# Patient Record
Sex: Female | Born: 2000 | Hispanic: Yes | Marital: Single | Smoking: Never smoker
Health system: Southern US, Community
[De-identification: ages and names within clinical notes are randomized; demographics above are authoritative.]

## PROBLEM LIST (undated history)

## (undated) DIAGNOSIS — I10 Essential (primary) hypertension: Secondary | ICD-10-CM

## (undated) HISTORY — PX: APPENDECTOMY: SHX54

---

## 2020-09-12 ENCOUNTER — Inpatient Hospital Stay (HOSPITAL_COMMUNITY)
Admission: AD | Admit: 2020-09-12 | Discharge: 2020-09-12 | Disposition: A | Discharge status: Home / Self Care | Payer: Medicaid - Out of State | Attending: Family Medicine | Admitting: Family Medicine

## 2020-09-12 ENCOUNTER — Encounter (HOSPITAL_COMMUNITY): Payer: Self-pay | Admitting: Family Medicine

## 2020-09-12 DIAGNOSIS — O26891 Other specified pregnancy related conditions, first trimester: Secondary | ICD-10-CM | POA: Diagnosis not present

## 2020-09-12 DIAGNOSIS — Z3490 Encounter for supervision of normal pregnancy, unspecified, unspecified trimester: Secondary | ICD-10-CM

## 2020-09-12 DIAGNOSIS — R109 Unspecified abdominal pain: Secondary | ICD-10-CM | POA: Insufficient documentation

## 2020-09-12 DIAGNOSIS — O0931 Supervision of pregnancy with insufficient antenatal care, first trimester: Secondary | ICD-10-CM | POA: Insufficient documentation

## 2020-09-12 DIAGNOSIS — Z3689 Encounter for other specified antenatal screening: Secondary | ICD-10-CM

## 2020-09-12 DIAGNOSIS — Z3A09 9 weeks gestation of pregnancy: Secondary | ICD-10-CM | POA: Diagnosis not present

## 2020-09-12 HISTORY — DX: Essential (primary) hypertension: I10

## 2020-09-12 LAB — POCT PREGNANCY, URINE: Preg Test, Ur: POSITIVE — AB

## 2020-09-12 LAB — HCG, QUANTITATIVE, PREGNANCY: hCG, Beta Chain, Quant, S: 2288 m[IU]/mL — ABNORMAL HIGH (ref <5)

## 2020-09-12 MED ORDER — NIFEDIPINE ER OSMOTIC RELEASE 30 MG PO TB24: 30.0000 mg | ORAL_TABLET | Freq: Every day | ORAL | 5 refills | Status: DC

## 2020-09-12 NOTE — MAU Note (Signed)
Had positive HPT yesterday. C/o abd pain since yesterday.  Denies any vag bleeding or discharge. Pt states she has been told she has high B/P not an any meds. 160/101 today ?

## 2020-09-12 NOTE — MAU Note (Signed)
Urine in lobby 

## 2020-09-12 NOTE — MAU Provider Note (Addendum)
Chief Complaint:  Abdominal Pain   None    HPI: Joyce Barnes is a 20 y.o. G1P0 at [redacted]w[redacted]d who presents to maternity admissions reporting wanting confirmation of pregnancy. She states she had a positive at home pregnancy test yesterday. LMP unknown, as she states she has irregular menses. She did not menstruate in January or December. She states she did have intermittent abdominal cramping throughout the past week but it has since resolved. Felt like her normal period cramping. Denies vaginal bleeding, vaginal discharge, chest pain, headaches, nausea, vomiting, fever, falls, or recent illness. Of note, she has had elevated BP readings in MAU, most recently 157/94. She states she has a history of elevated BP since she was 20 yo but has not ever taken antihypertensive medications. This is a welcomed pregnancy. She is taking a prenatal vitamin.   Pregnancy Course: She has not yet established prenatal care.   Past Medical History:  Diagnosis Date  . Hypertension    OB History  Gravida Para Term Preterm AB Living  1            SAB IAB Ectopic Multiple Live Births               # Outcome Date GA Lbr Len/2nd Weight Sex Delivery Anes PTL Lv  1 Current            Past Surgical History:  Procedure Laterality Date  . APPENDECTOMY     History reviewed. No pertinent family history. Social History   Tobacco Use  . Smoking status: Never Smoker  . Smokeless tobacco: Never Used  Substance Use Topics  . Alcohol use: Not Currently  . Drug use: Never   No Known Allergies No medications prior to admission.    I have reviewed patient's Past Medical Hx, Surgical Hx, Family Hx, Social Hx, medications and allergies.   ROS:  Review of Systems  Constitutional: Negative for chills, fatigue and fever.  Respiratory: Negative for cough, shortness of breath and wheezing.   Cardiovascular: Negative for chest pain, palpitations and leg swelling.  Gastrointestinal: Negative for abdominal pain,  nausea and vomiting.  Genitourinary: Negative for difficulty urinating, flank pain, vaginal bleeding and vaginal discharge.  Neurological: Negative.  Negative for headaches.    Physical Exam   Patient Vitals for the past 24 hrs:  BP Temp Pulse Resp SpO2 Weight  09/12/20 1445 (!) 157/94 -- (!) 123 -- -- --  09/12/20 1242 (!) 160/101 98.8 F (37.1 C) (!) 122 18 100 % 123.4 kg    Constitutional: Well-developed, well-nourished female in no acute distress.  Cardiovascular: normal rate & rhythm, no murmur Respiratory: normal effort, lung sounds clear throughout GI: Abd soft, non-tender, gravid appropriate for gestational age. Pos BS x 4 MS: Extremities nontender, no edema, normal ROM Neurologic: Alert and oriented x 4.  GU: no CVA tenderness Pelvic: not indicated      Labs: Results for orders placed or performed during the hospital encounter of 09/12/20 (from the past 24 hour(s))  Pregnancy, urine POC     Status: Abnormal   Collection Time: 09/12/20 12:34 PM  Result Value Ref Range   Preg Test, Ur POSITIVE (A) NEGATIVE  ABO/Rh     Status: None   Collection Time: 09/12/20  2:17 PM  Result Value Ref Range   ABO/RH(D)      O POS Performed at [facility], [address] [place], [place] [address]     Imaging:  No results found.  MAU  Course: Orders Placed This Encounter  Procedures  . Beta hCG quant (ref lab)  . Urinalysis, Routine w reflex microscopic Urine, Clean Catch  . hCG, quantitative, pregnancy  . Pregnancy, urine POC  . ABO/Rh  . Discharge patient Discharge disposition: 01-Home or Self Care; Discharge patient date: 09/12/2020   No orders of the defined types were placed in this encounter.   MDM: Beta HCG urine positive. HCG Quantitative and UA pending. Blood type O positive. Bedside US without definitive visualization of IUP, though question of GS seen near top of endometrial lining.   Assessment: 1. Pregnancy, unspecified gestational age      Plan: Discharge home in stable condition. Discussed with patient the importance of controlling high blood pressure during pregnancy. Prescribed nifedipine. Discussed with patient initiating prenatal care at [facility] and she is agreeable. Patient  will be notified of the results of her pending tests. Message sent to [facility] to schedule viability/dating scan.    Allergies as of 09/12/2020   No Known Allergies     Medication List    TAKE these medications   NIFEdipine 30 MG 24 hr tablet Commonly known as: PROCARDIA-XL/NIFEDICAL-XL Take 1 tablet (30 mg total) by mouth daily.       Raylene Everts, PA Student 09/12/2020 5:09 PM   ATTENDING ATTESTATION  I have seen and examined this patient and agree with the above documentation in the PA student's note except as below.  I have repeated all portions of the history and physical exam and edited the above document.   Venora Maples, MD/[facility] [employer] (Faculty Practice) 09/12/2020, 8:10 PM

## 2020-09-12 NOTE — Discharge Instructions (Signed)
https://www.acog.org/womens-health/faqs/prenatal-genetic-screening-tests">  Cuidados prenatales Prenatal Care El cuidado prenatal es la atencin de la salud durante el [place]. Ayuda a que usted y su beb en gestacin (feto) se mantengan tan saludables como sea posible. El cuidado prenatal puede ser proporcionado por una partera, un mdico de familia, un profesional de Joyce Barnes (enfermero especializado o Joyce Barnes (medical) mdico) o un mdico especializado en Psychiatrist y Joyce Barnes ([place]). Joyce Barnes modo me afecta? Durante el embarazo, la controlarn minuciosamente para Barnes, manufacturing cualquier afeccin que Agricultural consultant. Para disminuir el riesgo de sufrir Clinical cytogeneticist, usted y el mdico hablarn acerca de las afecciones subyacentes que tenga. Cmo afecta esto al beb? El cuidado prenatal recibido desde un principio y de forma peridica aumenta la probabilidad de que su beb permanezca sano durante el embarazo. El cuidado prenatal disminuye el riesgo de que el beb:  Nazca de forma temprana (prematuramente).  Sea ms pequeo de lo esperado al nacer (pequeo para la edad gestacional). Qu puedo esperar en la primera visita de cuidado prenatal? Su primera visita de cuidado prenatal probablemente ser la ms larga. Programe la primera visita de cuidado prenatal tan pronto como advierta que est embarazada. Su primera visita es un buen momento para hacer preguntas o hablar sobre las inquietudes que tenga acerca del [place]. Antecedentes mdicos En su visita, usted y el mdico hablarn sobre sus antecedentes mdicos, incluidos:  Cualquier embarazo anterior.  Sus antecedentes mdicos familiares.  Los antecedentes mdicos del padre del beb.  Cualquier afeccin de salud a largo plazo (crnica) que tenga y cmo controlarla.  Cirugas o procedimientos a los que se someti.  Consumo actual de medicamentos recetados o de venta libre, hierbas o suplementos.  Otros factores que podran  representar un riesgo para el beb, incluidos los siguientes: ? Exposicin a sustancias qumicas nocivas o a radiacin en el trabajo o en su casa. ? Consumo de sustancias, incluidos el tabaco, el alcohol y las drogas.  Su entorno en el hogar y sus niveles de estrs, por ejemplo: ? Exposicin al abuso o la violencia. ? Problemas econmicos en casa.  Sus hbitos de salud diarios, incluida la dieta y la actividad fsica. Pruebas y exmenes Su mdico:  Medir su peso, altura y presin arterial.  Le realizar un examen fsico, incluido un examen plvico y [place].  Le realizar anlisis de [place] y [place] para detectar: ? Infeccin de las vas urinarias. ? Enfermedades de transmisin sexual (ETS). ? Niveles bajos de hierro en la sangre (anemia). ? Grupo sanguneo y ciertas protenas en los glbulos rojos (anticuerpos Rh). ? Infecciones e inmunidad a los virus, como el de la hepatitis B y Social Joyce officer, government. ? VIH (virus de inmunodeficiencia humana).  Analizar sus opciones de estudios de deteccin genticos. Consejos para Joyce Barnes Energy salud El mdico tambin le brindar informacin acerca de cmo mantenerse sana y Pharmacologist sano a su beb, por ejemplo:  Nutricin y vitaminas.  Actividad fsica.  Cmo controlar los sntomas del [place], como nuseas y vmitos (nuseas matutinas).  Infecciones y sustancias que pueden ser perjudiciales para el beb, y cmo evitarlas.  Salubridad de [employer].  Cuidado dental.  Trabajo.  Viajes.  Signos de advertencia a los que debe estar atenta y cundo llamar al mdico. Con qu frecuencia tendr que asistir a visitas de cuidado prenatal? Despus de la primera visita de cuidado prenatal, tendr que asistir a visitas en forma regular durante el embarazo. El cronograma de visitas a menudo es el siguiente:  Hasta la semana 28 de embarazo: [name]  vez cada 4 semanas.  Desde la semana 28 a la 36: una vez cada 2 semanas.  Despus de la semana 36: todas  las semanas [employer] . Es posible que algunas mujeres deban asistir a visitas con mayor o Adult nurse frecuencia segn las afecciones subyacentes de salud que tengan y la salud del beb. Concurra a todas las visitas de cuidado prenatal y de seguimiento. Esto es importante. Qu sucede durante las visitas rutinarias de cuidado prenatal? Su mdico:  Medir su peso y presin arterial.  Verificar la presencia de sonidos cardacos fetales.  Medir la altura de su tero y abdomen (altura uterina). Esta puede medirse aproximadamente a partir de la semana 20 del [place].  Controlar la posicin del beb dentro del tero.  Le har preguntas acerca de su dieta, patrones de sueo y si puede sentir los movimientos del beb.  Revisar los signos de advertencia a los que debe estar atenta y los signos del trabajo de [place].  Le preguntar acerca de los sntomas relacionados con el embarazo que tenga y cmo est lidiando con ellos. Los sntomas pueden incluir: ? Dolores de [place]. ? Nuseas y vmitos. ? Secrecin vaginal. ? [place]inchazn. ? Fatiga. ? Estreimiento. ? Cambios en la visin. ? Sentir tristeza o ansiedad constantemente. ? Cualquier molestia, incluido el dolor plvico o de espalda. ? Manchado o sangrado. Haga una lista de las preguntas que tenga para hacerle al mdico en las visitas de [place].   Qu estudios me podran Barnes, product/process development las visitas de cuidado prenatal? Es posible que le realicen anlisis de [employer], [place] y estudios de diagnstico por imgenes durante todo el [place], como los siguientes:  Este anlisis examina la presencia de glucosa, protenas o signos de infeccin en la orina.  Pruebas de glucosa para detectar una forma de diabetes que pueda desarrollarse durante el embarazo (diabetes mellitus gestacional). Generalmente, esto se realiza alrededor de lasemana 24 de embarazo.  Ecografas para verificar el crecimiento y desarrollo del beb, detectar defectos congnitos  y Company secretary del beb. Estas tambin pueden ayudar a decidir cundo debe dar a luz al beb.  Un estudio para Arboriculturist infeccin por estreptococos del grupo B (EGB). Generalmente, esto se realiza alrededor de lasemana 36 de embarazo.  Pruebas genticas. Estas pueden incluir anlisis de [place], lquidos o tejidos, o estudios de diagnstico por imgenes, como una ecografa. Algunos estudios genticos se Web designer trimestre de Psychiatrist y otros durante el segundo trimestre. Qu otras cosas puedo esperar durante las visitas de cuidado prenatal? El mdico puede recomendarle que se aplique algunas vacunas durante el [place]. Pueden incluir:  Una vacuna contra la gripe anual. Esto es especialmente importante si estar embarazada durante la temporada de gripe.  La vacuna Tdap (ttanos, difteria y [place]). Recibir esta vacuna durante el embarazo puede proteger al beb de la tos convulsa (tos ferina) despus del nacimiento. Esta vacuna puede recomendarse [employer] 27 y 36 de [place].  Una vacuna contra el COVID-19. Ms adelante en su Vanetta Mulders, su mdico puede proporcionarle informacin acerca de:  Clases para prepararse para el parto y Patent examiner.  Cmo elegir un mdico para el beb.  Bancos de cordn umbilical.  Lactancia materna.  Utilizacin de mtodos anticonceptivos despus del nacimiento del beb.  La unidad de parto y [place] de parto del hospital, y cmo programar una visita.  Cmo registrarse en el hospital antes de comenzar el [place] de [place]. Dnde buscar ms informacin  Office on [employer] (Oficina  para la Salud de Architectural technologist): TravelLesson.ca  American Pregnancy Association (Asociacin Americana del [place]): americanpregnancy.org  March of Dimes: marchofdimes.org Resumen  El cuidado prenatal [place] a que usted y su beb se mantengan tan saludables como sea posible durante el [place].  Su primera visita de cuidado  prenatal probablemente ser la ms larga.  Tendr que asistir a visitas y International aid/development worker estudios durante todo el embarazo para Chief Operating Officer su salud y la salud del beb.  Lleve una lista de preguntas para hacerle al mdico durante las visitas.  Asegrese de asistir a todas las visitas de cuidado prenatal y de Clinical Joyce associate. Esta informacin no tiene Theme park manager el consejo del mdico. Asegrese de hacerle al mdico cualquier pregunta que tenga. Document Revised: 06/11/2020 Document Reviewed: 06/11/2020 Elsevier Patient Education  2021 [employer].

## 2020-09-13 ENCOUNTER — Telehealth: Payer: Self-pay | Admitting: Family Medicine

## 2020-09-13 LAB — ABO/RH: ABO/RH(D): O POS

## 2020-09-13 NOTE — Telephone Encounter (Signed)
Spanish - pt states she has vaginal itching and needs to know if this is normal. Pt has new ob appt  Wed 315pm..

## 2020-09-16 NOTE — Telephone Encounter (Signed)
Called pt w/[employer] interpreter # [phone]. She stated that the vaginal itching began on 1/27. She has not tried any OTC medications. I advised pt that her symptom may be from a vaginal yeast infection. She was advised that she may try OTC Monistat per package directions or she can wait until her appt on 2/2 and the doctor will perform a test. Pt also asked if it is normal to have cramping. I advised that this is normal during early pregnancy. If the cramping becomes severe or if she develops heavy vaginal bleeding, she should go immediately tot he hospital. Pt voiced understanding of all information and instructions given.

## 2020-09-18 ENCOUNTER — Other Ambulatory Visit: Payer: Self-pay

## 2020-09-18 ENCOUNTER — Ambulatory Visit (INDEPENDENT_AMBULATORY_CARE_PROVIDER_SITE_OTHER): Payer: Medicaid - Out of State | Admitting: Family Medicine

## 2020-09-18 VITALS — BP 151/82 | HR 108 | Ht 66.0 in | Wt 270.8 lb

## 2020-09-18 DIAGNOSIS — Z3491 Encounter for supervision of normal pregnancy, unspecified, first trimester: Secondary | ICD-10-CM

## 2020-09-18 DIAGNOSIS — O10919 Unspecified pre-existing hypertension complicating pregnancy, unspecified trimester: Secondary | ICD-10-CM | POA: Insufficient documentation

## 2020-09-18 DIAGNOSIS — O0990 Supervision of high risk pregnancy, unspecified, unspecified trimester: Secondary | ICD-10-CM | POA: Insufficient documentation

## 2020-09-18 MED ORDER — NIFEDIPINE ER OSMOTIC RELEASE 60 MG PO TB24: 60.0000 mg | ORAL_TABLET | Freq: Every day | ORAL | 5 refills | Status: DC

## 2020-09-18 NOTE — Progress Notes (Addendum)
Pt has scheduled [place] for dating on 2/14 at 1545 pm with MFM due to unknown LMP. Pt aware and agreeable.   Used [employer] [phone]

## 2020-09-18 NOTE — Progress Notes (Signed)
     Subjective:   Joyce Barnes is a 20 y.o. K1S0 at [redacted]w[redacted]d by uncertain LMP being seen today for her first obstetrical visit.  Her obstetrical history is significant for cHTN, obesity.   Although patient initially here for new OB, on bedside US only barely able to see IUP with embryo, no fetal cardiac activity observed.   HISTORY: OB History  Gravida Para Term Preterm AB Living  1 0 0 0 0 0  SAB IAB Ectopic Multiple Live Births  0 0 0 0 0    # Outcome Date GA Lbr Len/2nd Weight Sex Delivery Anes PTL Lv  1 Current            Last pap smear was: none on file  Past Medical History:  Diagnosis Date  . Hypertension    Past Surgical History:  Procedure Laterality Date  . APPENDECTOMY     No family history on file. Social History   Tobacco Use  . Smoking status: Never Smoker  . Smokeless tobacco: Never Used  Substance Use Topics  . Alcohol use: Not Currently  . Drug use: Never   No Known Allergies No current outpatient medications on file prior to visit.   No current facility-administered medications on file prior to visit.     Exam   Vitals:   09/18/20 1559 09/18/20 1611  BP: (!) 151/82   Pulse: (!) 108   Weight: 270 lb 12.8 oz (122.8 kg)   Height:  $Remove'5\' 6"'HNQPODS$  (1.676 m)      Uterus:     System: General: well-developed, well-nourished female in no acute distress   Breast:  normal appearance, no masses or tenderness   Skin: normal coloration and turgor, no rashes   Neurologic: oriented, normal, negative, normal mood   Extremities: normal strength, tone, and muscle mass, ROM of all joints is normal   HEENT PERRLA, extraocular movement intact and sclera clear, anicteric   Mouth/Teeth mucous membranes moist, pharynx normal without lesions and dental hygiene good   Neck supple and no masses   Respiratory:  no respiratory distress     Assessment:   Pregnancy: G1P0 Patient Active Problem List   Diagnosis Date Noted  . Supervision of high risk pregnancy,  antepartum 09/18/2020  . Chronic hypertension affecting pregnancy 09/18/2020     Plan:  1. Uncertain dates, antepartum, first trimester Patient here for new OB but appears to be only 4-[redacted] weeks along Discussed with patient that we should have a dating and viability scan and reschedule a new OB based on this new dating She is in agreement with this plan - US OB LESS THAN 14 WEEKS WITH OB TRANSVAGINAL; Future  2. Supervision of high risk pregnancy, antepartum Basic labs obtained today in anticipation of subsequent new OB visit Defer remainder until next visit (Pap, genetic screening, etc.) - Hemoglobin A1c - CBC/D/Plt+RPR+Rh+ABO+Rub Ab... - Culture, OB Urine  3. Chronic hypertension affecting pregnancy Seen by me in the MAU last week, started on Nifed 30 XL Today BP's remain in high end of mild range, will increase Nifed to 60 XL daily Baseline labs - NIFEdipine (PROCARDIA XL/NIFEDICAL XL) 60 MG 24 hr tablet; Take 1 tablet (60 mg total) by mouth daily.  Dispense: 30 tablet; Refill: 5 - Protein / creatinine ratio, urine - Comp Met (CMET)    Return in 6 weeks (on 10/30/2020).

## 2020-09-18 NOTE — Patient Instructions (Signed)
Obstetrics: Normal and Problem Pregnancies (7th ed., pp. 102-121). [place], [place]: Elsevier."> Textbook of Family Medicine (9th ed., pp. [id number]). [place]: Restaurant manager, fast food trimestre de Psychiatrist First Trimester of Pregnancy  El primer trimestre de Community education officer de su ltimo periodo menstrual hasta el final de la semana 12. Es Designer, jewellery desde el mes 1 hasta el mes 3 de [place]. Una semana despus de que un espermatozoide fecunda un vulo, este se implantar en la pared uterina y comenzar a desarrollarse hasta convertirse en un beb. Al final de las 12 [address], se formarn todos los rganos del beb y el beb tendr un tamao de [place] 2 y 3 pulgadas. Durante Financial risk analyst trimestre, ocurren cambios en el cuerpo Su organismo atraviesa por muchos cambios durante el [place]. Los cambios varan y generalmente vuelven a la normalidad despus del nacimiento del beb. Cambios fsicos  Usted puede aumentar o bajar de [place].  Las ConAgra Foods pueden empezar a Government social research officer y Emergency planning/management officer. El tejido que rodea los pezones (arola) puede tornarse ms oscuro.  Pueden aparecer zonas oscuras o manchas (cloasma o mscara del embarazo) en el rostro.  Tal vez haya cambios en el cabello. Estos pueden incluir engrosamiento, afinamiento y Allied Waste Industries textura. Cambios en la salud  Quizs sienta nuseas y es posible que vomite.  Es posible que tenga [place] estomacal.  Comienza a tener dolores de [place].  Puede tener estreimiento.  Las Veterinary surgeon y estar sensibles al cepillado y al hilo dental. Otros cambios  Puede cansarse con facilidad.  Puede orinar con mayor frecuencia.  Los perodos menstruales se interrumpirn.  Tal vez no tenga apetito.  Puede sentir un fuerte deseo de consumir ciertos alimentos.  Puede tener cambios en sus emociones de un da para Therapist, art.  Tendr sueos ms vvidos y extraos. Siga estas instrucciones en su  casa: Medicamentos  Siga las instrucciones del mdico en relacin con el uso de medicamentos. Durante el embarazo, hay medicamentos que pueden tomarse y otros que no. No use ningn medicamento a menos que se los haya indicado el mdico.  Tome vitaminas prenatales que contengan por lo menos (mcg) de cido flico. Comida y bebida  Lleve una dieta saludable que incluya frutas y verduras frescas, cereales integrales, buenas fuentes de protenas como carnes [place], huevos o tofu, y productos lcteos descremados.  Evite la carne cruda y el [place], la [place] y el queso sin Market researcher. Estos portan grmenes que pueden provocar dao tanto a usted como al beb.  Siente nuseas o vomita: ? Ingiera 4 o 5comidas pequeas por Geophysical data processor de 3abundantes. ? Intente comer algunas galletitas saladas. ? Beba lquidos Altria Group, en lugar de [employer] Scientific.  Es posible que tenga que tomar estas medidas para prevenir o tratar el estreimiento: ? Product manager suficiente lquido como para Pharmacologist la orina de color amarillo plido. ? Consumir alimentos ricos en fibra, como frijoles, cereales integrales, y frutas y verduras frescas. ? Limitar el consumo de alimentos ricos en grasa y azcares procesados, como los alimentos fritos o dulces. Actividad  Haga ejercicio solamente como se lo haya indicado el mdico. La mayora de las personas pueden continuar su rutina de ejercicios durante el [place]. Intente realizar como mnimo de actividad fsica por lo menos 5das a la semana.  Deje de hacer ejercicio si le aparecen dolor o clicos en la parte baja del vientre o de la espalda.  Evite hacer ejercicio si hace mucho calor o humedad,  o si se encuentra a una altitud elevada.  Evite levantar pesos Fortune Brands.  Si lo desea, puede seguir teniendo The [place]. [place] Travelers, salvo que el mdico le indique lo contrario. Alivio del dolor y del Dentist  Use un sostn que le brinde buen  soporte para Engineer, materials de [place].  Descanse con las piernas elevadas si tiene calambres o dolor de cintura.  Si desarrolla venas abultadas (vrices) en las piernas: ? Use medias de compresin como se lo haya indicado el mdico. ? Eleve los pies durante , 3 o 4veces por da. ? Limite el consumo de sal en su dieta. Seguridad  Use el cinturn de seguridad en todo momento mientras conduce o va en auto.  Hable con el mdico si es vctima de [employer] o fsico.  Hable con el mdico si se siente triste o tiene pensamientos acerca de [place] dao a usted misma. Estilo de vida  No se d baos de inmersin en agua caliente, baos turcos ni saunas.  No se haga duchas vaginales. No use tampones ni toallas higinicas perfumadas.  No use remedios a base de hierbas, alcohol, drogas ilegales ni medicamentos que no estn aprobados por el mdico. Las sustancias qumicas de estos productos pueden daar al beb.  No consuma ningn producto que contenga nicotina o tabaco, como cigarrillos, cigarrillos electrnicos y tabaco de Theatre manager. Si necesita ayuda para dejar de fumar, consulte al mdico.  Evite el contacto con las bandejas sanitarias de los gatos y la tierra que estos animales usan. Estos elementos contienen bacterias que pueden causar defectos congnitos al beb y la posible prdida del beb en gestacin (feto) debido a un aborto espontneo o muerte fetal. Instrucciones generales  Durante las visitas prenatales de rutina del Engineer, maintenance trimestre, el mdico le har un examen fsico, Education officer, environmental las pruebas necesarias y le preguntar cmo Merchant navy officer las cosas. Cumpla con todas las visitas de seguimiento. Esto es importante.  Pida ayuda si tiene necesidades nutricionales o de asesoramiento Academic librarian. El mdico puede aconsejarla o derivarla a especialistas para que la ayuden con diferentes necesidades.  Programe una cita con el dentista. En su casa, lvese los dientes con un cepillo  dental suave. Psese el hilo dental suavemente.  Escriba sus preguntas. Llvelas cuando concurra a las visitas prenatales. Dnde buscar ms informacin  American Pregnancy Association (Asociacin Americana del Embarazo): americanpregnancy.org  Cel[employer] Corporation of Obstetricians and Gynecologists (Colegio Estadounidense de Obstetras y [place]): EmploymentAssurance.cz?  Office on Pitney Bowes (Oficina para la Salud de la Mujer): [email or web address] Comunquese con un mdico si tiene:  Research scientist (life sciences).  Grant Ruts.  Clicos leves, presin en la pelvis o dolor persistente en el abdomen.  Nuseas, vmitos o diarrea que duran 24 horas o ms.  Una secrecin vaginal con mal olor.  Dolor al Beatrix Shipper.  Una enfermedad contagiosa, como varicela, sarampin, virus de [place], VIH o hepatitis. Solicite ayuda inmediatamente si:  Tiene manchado o sangrado de la vagina.  Tiene dolor intenso o clicos en el abdomen.  Siente que le falta el aire o dolor en el pecho.  Sufre cualquier tipo de traumatismo, por ejemplo, debido a una cada o un accidente automovilstico.  Dolor, hinchazn o enrojecimiento nuevos en un brazo o una pierna, o un aumento de alguno de estos sntomas. Resumen  Financial risk analyst trimestre del Community education officer de su ltimo periodo menstrual hasta el final de la semana 12 (meses 1 al 3).  Hacer 4 o 5 comidas pequeas al Geophysical data processor de 3 comidas grandes  tambin [employer]s nuseas y los vmitos.  No consuma ningn producto que contenga nicotina o tabaco, como cigarrillos, cigarrillos electrnicos y tabaco de Theatre manager. Si necesita ayuda para dejar de fumar, consulte al mdico.  Cumpla con todas las visitas de seguimiento. Esto es importante. Esta informacin no tiene Theme park manager el consejo del mdico. Asegrese de hacerle al mdico cualquier pregunta que tenga. Document Revised: 02/05/2020 Document Reviewed: 02/05/2020 Elsevier Patient Education   2021 Elsevier Inc.   Southern Company del mtodo anticonceptivo Contraception Choices La anticoncepcin, o los mtodos anticonceptivos, hace referencia a los mtodos o dispositivos que evitan el [place]. Mtodos hormonales Implante anticonceptivo Un implante anticonceptivo consiste en un tubo delgado de plstico que contiene una hormona que evita el [place]. Es diferente de un dispositivo intrauterino (DIU). Un mdico lo inserta en la parte superior del brazo. Los implantes pueden ser eficaces durante un mximo de 3 aos. Inyecciones de progestina sola Las inyecciones de progestina sola contienen progestina, una forma sinttica de la hormona progesterona. Un mdico las administra cada 3 meses. Pldoras anticonceptivas Las pldoras anticonceptivas son pastillas que contienen hormonas que evitan el [place]. Deben tomarse una vez al da, preferentemente a la misma Economist. Se necesita una receta para utilizar este mtodo anticonceptivo. Parche anticonceptivo El parche anticonceptivo contiene hormonas que evitan el [place]. Se coloca en la piel, debe cambiarse una vez a la semana durante tres semanas y debe retirarse en la cuarta semana. Se necesita una receta para utilizar este mtodo anticonceptivo. Anillo vaginal Un anillo vaginal contiene hormonas que evitan el embarazo. Se coloca en la vagina durante tres semanas y se retira en la cuarta semana. Luego se repite el proceso con un anillo nuevo. Se necesita una receta para utilizar este mtodo anticonceptivo. Anticonceptivo de emergencia Los anticonceptivos de emergencia son mtodos para evitar un embarazo despus de Warehouse manager sexo sin proteccin. Vienen en forma de pldora y pueden tomarse hasta 5 das despus de [place]. Funcionan mejor cuando se toman lo ms pronto posible luego de eBay. La mayora de los anticonceptivos de emergencia estn disponibles sin receta mdica. Este mtodo no debe utilizarse como el nico mtodo anticonceptivo.    Mtodos de barrera Condn masculino Un condn masculino es una vaina delgada que se coloca sobre el pene durante el sexo. Los condones evitan que el esperma ingrese en el cuerpo de la [place]. Pueden utilizarse con un una sustancia que mata a los espermatozoides (espermicida) para aumentar la efectividad. Deben desecharse despus de un uso. Condn femenino Un condn femenino es una vaina blanda y holgada que se coloca en la vagina antes de [place]. El condn evita que el esperma ingrese en el cuerpo de la [place]. Deben desecharse despus de un uso. Diafragma Un diafragma es una barrera blanda con forma de cpula. Se inserta en la vagina antes del sexo, junto con un espermicida. El diafragma bloquea el ingreso de esperma en el tero, y el espermicida mata a los espermatozoides. El Designer, fashion/clothing en la vagina durante 6 a 8 horas despus de Warehouse manager sexo y debe retirarse en el plazo de las 24 horas. Un diafragma es recetado y colocado por un mdico. Debe reemplazarse cada 1 a 2 aos, despus de dar a luz, de aumentar ms de 15lb (6.8kg) y de [place] plvica. Capuchn cervical Un capuchn cervical es una copa redonda y blanda de ltex o plstico que se coloca en el cuello uterino. Se inserta en la vagina antes del sexo, junto  con un espermicida. Bloquea el ingreso del esperma en el tero. El capuchn Radio producerdebe permanecer en el lugar durante 6 a 8 horas despus de Warehouse managertener sexo y debe retirarse en el plazo de las 48 horas. Un capuchn cervical debe ser recetado y colocado por un mdico. Debe reemplazarse cada 2aos. Esponja Una esponja es una pieza blanda y circular de espuma de poliuretano que contiene espermicida. La esponja ayuda a bloquear el ingreso de esperma en el tero, y el espermicida mata a los espermatozoides. Belva BertinPara utilizarla, debe humedecerla e insertarla en la vagina. Debe insertarse antes de eBaytener sexo, debe permanecer dentro al menos durante 6 horas despus de tener sexo y debe  retirarse y Nurse, adultdesecharse en el plazo de las 30 horas. Espermicidas Los espermicidas son sustancias qumicas que matan o bloquean al esperma y no lo dejan ingresar al cuello uterino y al tero. Vienen en forma de crema, gel, supositorio, espuma o comprimido. Un espermicida debe insertarse en la vagina con un aplicador al menos 10 o 15 minutos antes de tener sexo para dar tiempo a que surta Janesvilleefecto. El proceso debe repetirse cada vez que tenga sexo. Los espermicidas no requieren Emergency planning/management officerreceta mdica.   Anticonceptivos intrauterinos Dispositivo intrauterino (DIU) Un DIU es un dispositivo en forma de T que se coloca en el tero. Existen dos tipos:  DIU hormonal.Este tipo contiene progestina, una forma sinttica de la hormona progesterona. Este tipo puede permanecer colocado durante 3 a 5 aos.  DIU de cobre.Este tipo est recubierto con un alambre de cobre. Puede permanecer colocado durante 10 aos. Mtodos anticonceptivos permanentes Ligadura de trompas en la mujer En este mtodo, se sellan, atan u obstruyen las trompas de [place]op[place] durante una ciruga para Automotive engineerevitar que el vulo descienda Raleighhacia el tero. Esterilizacin histeroscpica En este mtodo, se coloca un implante pequeo y flexible dentro de cada trompa de Falopio. Los implantes hacen que se forme un tejido cicatricial en las trompas de Falopio y que las obstruya para que el espermatozoide no pueda llegar al vulo. El procedimiento demora alrededor de 3 meses para que sea [place] Clubefectivo. Debe utilizarse otro mtodo anticonceptivo durante esos 3 meses. Esterilizacin masculina Este es un procedimiento que consiste en atar los conductos que transportan el esperma (vasectoma). Luego del procedimiento, el hombre Manufacturing engineerpuede eyacular lquido (semen). Debe utilizarse otro mtodo anticonceptivo durante 3 meses despus del procedimiento. Mtodos de planificacin natural Planificacin familiar natural En este mtodo, la pareja no tiene American Family Insurancesexo durante los das en que la mujer  podra quedar Beaver Creekembarazada. Mtodo calendario En este mtodo, la mujer realiza un seguimiento de la duracin de cada ciclo menstrual, identifica los Becton, Dickinson and Companydas en los que se puede producir un Psychiatristembarazo y no tiene sexo durante esos [address]  [address]das. Mtodo de la ovulacin En este mtodo, la pareja evita tener sexo durante la ovulacin. Mtodo sintotrmico Este mtodo implica no tener sexo durante la ovulacin. Normalmente, la mujer comprueba la ovulacin al observar cambios en su temperatura y en la consistencia del moco cervical. Mtodo posovulacin En este mtodo, la pareja espera a que finalice la ovulacin para Doctor, hospitaltener sexo. Dnde buscar ms informacin  Centers for Disease Control and Prevention (Centros para el Control y Psychiatristla Prevencin de Event organisernfermedades): FootballExhibition.com.brwww.cdc.gov Resumen  La anticoncepcin, o los mtodos anticonceptivos, hace referencia a los mtodos o dispositivos que evitan el Centropolisembarazo.  Los mtodos anticonceptivos hormonales incluyen implantes, inyecciones, pastillas, parches, anillos vaginales y anticonceptivos de Associate Professoremergencia.  Los mtodos anticonceptivos de barrera pueden incluir condones masculinos, condones femeninos, diafragmas, capuchones cervicales, esponjas y espermicidas.  Guardian Life Insurance tipos de DIU (dispositivo intrauterino). Un DIU puede colocarse en el tero de una mujer para evitar el embarazo durante 3 a 5 aos.  La esterilizacin permanente puede realizarse mediante un procedimiento tanto en los hombres como en las mujeres. Los The Kroger de Medical sales representative natural implican no tener American Family Insurance la mujer podra quedar [place]. Esta informacin no tiene Theme park manager el consejo del mdico. Asegrese de hacerle al mdico cualquier pregunta que tenga. Document Revised: 03/05/2020 Document Reviewed: 03/05/2020 Elsevier Patient Education  2021 Elsevier Inc.   Stewart materna Breastfeeding  Decidir amamantar es una de las mejores elecciones que puede hacer por  usted y su beb. Un cambio en las hormonas durante el embarazo hace que las mamas produzcan leche materna en las glndulas productoras de [place] a la [place]. Las hormonas impiden que la leche materna sea liberada antes del nacimiento del beb. Adems, impulsan el flujo de leche luego del nacimiento. Una vez que ha comenzado a Museum/gallery exhibitions officer, Conservation officer, nature beb, as Immunologist succin o Theatre manager, pueden estimular la liberacin de [place] de las glndulas productoras de [place]. Los beneficios de Smith International investigaciones demuestran que la lactancia materna ofrece muchos beneficios de salud para bebs y [place]. Adems, ofrece una forma gratuita y conveniente de Corporate treasurer al beb. Para el beb  La primera leche (calostro) ayuda a Careers information officer funcionamiento del aparato digestivo del beb.  Las clulas especiales de la leche (anticuerpos) ayudan a Artist las infecciones en el beb.  Los bebs que se alimentan con leche materna tambin tienen menos probabilidades de tener asma, alergias, obesidad o diabetes de tipo 2. Adems, tienen menor riesgo de sufrir el sndrome de muerte sbita del lactante (SMSL).  Los nutrientes de la [place] materna son mejores para Patent examiner las necesidades del beb en comparacin con la CHS Inc.  La leche materna mejora el desarrollo cerebral del beb. Para usted  La lactancia materna favorece el desarrollo de un vnculo muy especial entre la madre y el beb.  Es conveniente. La leche materna es econmica y siempre est disponible a la Human resources officer.  La lactancia materna ayuda a quemar caloras. Claude Manges a perder el peso ganado durante el [place].  Hace que el tero vuelva al tamao que tena antes del embarazo ms rpido. Adems, disminuye el sangrado (loquios) despus del parto.  La lactancia materna contribuye a reducir Nurse, adult de tener diabetes de tipo 2, osteoporosis, artritis reumatoide, enfermedades cardiovasculares y cncer de mama, ovario, tero y endometrio en  el futuro. Informacin bsica sobre la lactancia Comienzo de la lactancia  Encuentre un lugar cmodo para sentarse o Teacher, music, con un buen respaldo para el cuello y la espalda.  Coloque una almohada o una manta enrollada debajo del beb para acomodarlo a la altura de la mama (si est sentada). Las almohadas para Museum/gallery exhibitions officer se han diseado especialmente a fin de servir de apoyo para los brazos y el beb [employer] Foods.  Asegrese de que la barriga del beb (abdomen) est frente a la suya.  [place]ee suavemente la mama. Con las yemas de los dedos, Liberty Media bordes exteriores de la mama hacia adentro, en direccin al pezn. Esto estimula el flujo de [place]. Si la Home [employer]ot, es posible que deba Educational psychologist con este movimiento durante la Market researcher.  Sostenga la mama con 4 dedos por debajo y Multimedia programmer por arriba del pezn (forme la letra "C" con la mano). Asegrese de que los dedos se encuentren lejos del pezn  y de la boca del beb.  Empuje suavemente los labios del beb con el pezn o con el dedo.  Cuando la boca del beb se abra lo suficiente, acrquelo rpidamente a la mama e introduzca todo el pezn y la arola, tanto como sea posible, dentro de la boca del beb. La arola es la zona de color que rodea al pezn. ? Debe haber ms arola visible por arriba del labio superior del beb que por debajo del labio inferior. ? Los labios del beb deben estar abiertos y extendidos hacia afuera (evertidos) para asegurar que el beb se prenda de forma adecuada y cmoda. ? La lengua del beb debe estar entre la enca inferior y Educational psychologist.  Asegrese de que la boca del beb est en la posicin correcta alrededor del pezn (prendido). Los labios del beb deben crear un sello sobre la mama y estar doblados hacia afuera (invertidos).  Es comn que el beb succione durante 2 a 3 minutos para que comience el flujo de [place]. Cmo debe prenderse Es muy importante que le ensee al beb cmo  prenderse adecuadamente a la mama. Si el beb no se prende adecuadamente, puede causar Federated Department Stores, reducir la produccin de [place] materna y Radio producer que el beb tenga un escaso aumento de [place]. Adems, si el beb no se prende adecuadamente al pezn, puede tragar aire durante la alimentacin. Esto puede causarle molestias al beb. Hacer eructar al beb al P[place] Plate de mama puede ayudarlo a liberar el aire. Sin embargo, ensearle al beb cmo prenderse a la mama adecuadamente es la mejor manera de evitar que se sienta molesto por tragar Oceanographer se alimenta. Signos de que el beb se ha prendido adecuadamente al pezn  Tironea o succiona de modo silencioso, sin Publishing rights manager. Los labios del beb deben estar extendidos hacia afuera (evertidos).  Se escucha que traga cada 3 o 4 succiones una vez que la [employer] ha comenzado a Radiographer, therapeutic (despus de que se produzca el reflejo de eyeccin de la [place]).  Hay movimientos musculares por arriba y por delante de sus odos al Printmaker. Signos de que el beb no se ha prendido Audiological scientist al pezn  Hace ruidos de succin o de chasquido mientras se Tree surgeon.  Siente dolor en los pezones. Si cree que el beb no se prendi correctamente, deslice el dedo en la comisura de la boca y Ameren Corporation las encas del beb para interrumpir la succin. Intente volver a comenzar a Museum/gallery exhibitions officer. Signos de Market researcher materna exitosa Signos del beb  El beb disminuir gradualmente el nmero de succiones o dejar de succionar por completo.  El beb se quedar dormido.  El cuerpo del beb se relajar.  El beb retendr [name] pequea cantidad de [facility] boca.  El beb se desprender solo del [place]. Signos que presenta usted  Las mamas han aumentado la firmeza, el peso y el tamao 1 a 3 horas despus de Museum/gallery exhibitions officer.  Estn ms blandas inmediatamente despus de amamantar.  Se producen un aumento del volumen de [place] y un cambio en su consistencia y color hacia el  quinto da de Market researcher.  Los pezones no duelen, no estn agrietados ni sangran. Signos de que su beb recibe la cantidad de leche suficiente  Mojar por lo menos 1 o 2paales durante las primeras 24horas despus del nacimiento.  Mojar por lo menos 5 o 6paales cada 24horas durante la primera semana despus del nacimiento. La orina debe ser clara o de color amarillo  plido a los 5das de vida.  Mojar entre 6 y 8paales cada 24horas a medida que el beb sigue creciendo y desarrollndose.  Defeca por lo menos 3 veces en 24 horas a los 5 [address] de [address] Las heces deben ser blandas y Armed forces operational officer.  Defeca por lo menos 3 veces en 24 horas a los [address] de [address] Las heces deben ser grumosas y Armed forces operational officer.  No registra una prdida de peso mayor al 10% del peso al nacer durante los primeros 3 [address] de [place].  Aumenta de peso un promedio de 4 a 7onzas (113 a 198g) por semana despus de los 4 [address] de vida.  Aumenta de [place], [place], de [place] uniforme a Glass blower/designer de los 5 [address] de vida, sin Passenger transport manager prdida de peso despus de las 2semanas de vida. Despus de alimentarse, es posible que el beb regurgite una pequea cantidad de [place]. Esto es normal. Frecuencia y duracin de la lactancia El amamantamiento frecuente la ayudar a producir ms [place] y puede prevenir dolores en los pezones y las mamas extremadamente llenas (congestin [place]). Alimente al beb cuando muestre signos de hambre o si siente la necesidad de reducir la congestin de las [place]. Esto se denomina "lactancia a demanda". Las seales de que el beb tiene hambre incluyen las siguientes:  Aumento del Orr de [place], [place] o inquietud.  Mueve la cabeza de un lado a otro.  Abre la boca cuando se le toca la mejilla o la comisura de la boca (reflejo de bsqueda).  Aumenta las vocalizaciones, tales como sonidos de succin, se relame los labios, emite arrullos, suspiros o chirridos.  Mueve la [employer] boca y se chupa  los dedos o las manos.  Est molesto o llora. Evite el uso del chupete en las primeras 4 a 6 semanas despus del nacimiento del beb. Despus de este perodo, podr usar un chupete. Las investigaciones demostraron que el uso del chupete durante Financial risk analyst ao de vida del beb disminuye el riesgo de tener el sndrome de muerte sbita del lactante (SMSL). Permita que el nio se alimente en cada mama todo lo que desee. Cuando el beb se desprende o se queda dormido mientras se est alimentando de la primera mama, ofrzcale la segunda. Debido a que, con frecuencia, los recin nacidos estn somnolientos las primeras semanas de vida, es posible que deba despertar al beb para alimentarlo. Los horarios de Acupuncturist de un beb a otro. Sin embargo, las siguientes reglas pueden servir como gua para ayudarla a Lawyer que el beb se alimenta adecuadamente:  Se puede amamantar a los recin nacidos (bebs de 4 semanas o menos de vida) cada 1 a 3 horas.  No deben transcurrir ms de 3 horas durante el da o 5 horas durante la noche sin que se amamante a los recin nacidos.  Debe amamantar al beb un mnimo de 8 veces en un perodo de 24 horas. Extraccin de [employer] extraccin y Contractor de la leche materna le permiten asegurarse de que el beb se alimente exclusivamente de su leche materna, aun en momentos en los que no puede Museum/gallery exhibitions officer. Esto tiene especial importancia si debe regresar al Aleen Campi en el perodo en que an est amamantando o si no puede estar presente en los momentos en que el beb debe alimentarse. Su asesor en lactancia puede ayudarla a Clinical research associate un mtodo de extraccin que funcione mejor para usted y Programmer, systems cunto tiempo es seguro almacenar [place].  Cmo cuidar las mamas durante la lactancia Los pezones pueden secarse, Lobbyist y doler durante la Market researcher. Las siguientes recomendaciones pueden ayudarla a Pharmacologist las [employer] y  sanas:  Careers information officer usar jabn en los pezones.  Use un sostn de soporte diseado especialmente para la lactancia materna. Evite usar sostenes con aro o sostenes muy ajustados (sostenes deportivos).  Seque al aire sus pezones durante 3 a despus de amamantar al beb.  Utilice solo apsitos de Haematologist sostn para Environmental health practitioner las prdidas de [place]. La prdida de un poco de [employer] Group tomas es normal.  Utilice lanolina sobre los pezones luego de Museum/gallery exhibitions officer. La lanolina ayuda a mantener la humedad normal de la piel. La lanolina pura no es perjudicial (no es txica) para el beb. Adems, puede extraer [employer] algunas gotas de [place] materna y Engineer, maintenance (IT) suavemente esa [employer] pezones para que la [place] se seque al aire. Durante las primeras semanas despus del nacimiento, algunas mujeres experimentan [place]. La congestin [employer] [place] [employer] puede hacer que sienta las mamas pesadas, calientes y sensibles al tacto. El pico de la congestin mamaria ocurre en el plazo de los 3 a 5 das despus del Green. Las siguientes recomendaciones pueden ayudarla a Paramedic la congestin mamaria:  Vace por completo las mamas al [employer] o Environmental health practitioner. Puede aplicar calor hmedo en las mamas (en la ducha o con toallas hmedas para manos) antes de Museum/gallery exhibitions officer o extraer [employer]. Esto aumenta la circulacin y [place] a que la [place]. Si el beb no vaca por completo las [address] cuando lo [address], extraiga la [place] restante despus de que haya finalizado.  Aplique compresas de hielo [employer] inmediatamente despus de Museum/gallery exhibitions officer o extraer [place], a menos que le resulte demasiado incmodo. Haga lo siguiente: ? Ponga el hielo en una bolsa plstica. ? Coloque una First[employer] Corp piel y la bolsa de hielo. ? Coloque el hielo durante , 2 o 3veces por da.  Asegrese de que el beb est prendido y se encuentre en la posicin correcta mientras lo alimenta. Si la congestin mamaria  persiste luego de 48 horas o despus de seguir estas recomendaciones, comunquese con su mdico o un Holiday representative. Recomendaciones de salud general durante la lactancia  Consuma 3 comidas y 3 colaciones saludables todos los [place]. Las [employer] Holdings bien alimentadas que amamantan necesitan entre 450 y 500 caloras adicionales por Futures trader. Puede cumplir con este requisito al aumentar la cantidad de una dieta equilibrada que realice.  Beba suficiente agua para mantener la orina clara o de color amarillo plido.  Descanse con frecuencia, reljese y siga tomando sus vitaminas prenatales para prevenir la fatiga, el estrs y los niveles bajos de vitaminas y [employer] en el cuerpo (deficiencias de nutrientes).  No consuma ningn producto que contenga nicotina o tabaco, como cigarrillos y Administrator, Civil Service. El beb puede verse afectado por las sustancias qumicas de los cigarrillos que pasan a la [place] materna y por la exposicin al humo ambiental del tabaco. Si necesita ayuda para dejar de fumar, consulte al mdico.  Evite el consumo de alcohol.  No consuma drogas ilegales o marihuana.  Antes de Dietitian, hable con el mdico. Estos incluyen medicamentos recetados y de [place], como tambin vitaminas y suplementos a base de hierbas. Algunos medicamentos, que pueden ser perjudiciales para el beb, pueden pasar a travs de la [employer] Pal[employer].  Puede quedar embarazada durante la lactancia. Si se desea un mtodo anticonceptivo, consulte al [employer] Company  son las opciones seguras durante la Market researcherlactancia. Dnde encontrar ms informacin: Liga internacional [place]ec[place]: [email or web address]://[email or web address]. Comunquese con un mdico si:  Siente que quiere dejar de Museum/gallery exhibitions officeramamantar o se siente frustrada con la lactancia.  Sus pezones estn agrietados o Water quality scientistsangran.  Sus mamas estn irritadas, sensibles o calientes.  Tiene los siguientes sntomas: ? Dolor en las mamas o en los pezones. ? Un rea hinchada en cualquiera  de las mamas. ? Grant RutsFiebre o escalofros. ? Nuseas o vmitos. ? Drenaje de otro lquido distinto de la WPS Resourcesleche materna desde los pezones.  Sus mamas no se llenan antes de Museum/gallery exhibitions officeramamantar al beb para el quinto da despus del Silver Cityparto.  Se siente triste y deprimida.  El beb: ? Est demasiado somnoliento como para comer bien. ? Tiene problemas para dormir. ? Tiene ms de 1 semana de vida y HCA Incmoja menos de 6 paales en un periodo de 24 horas. ? No ha aumentado de Carrilloburghpeso a los [address] das de [address].  El beb defeca menos de 3 veces en 24 horas.  La piel del beb o las partes blancas de los ojos se vuelven amarillentas. Solicite ayuda de inmediato si:  El beb est muy cansado Retail buyer(letargo) y no se quiere despertar para comer.  Le sube la fiebre sin causa. Resumen  La lactancia materna ofrece muchos beneficios de salud para bebs y Hosmermadres.  Intente amamantar a su beb cuando muestre signos tempranos de hambre.  Haga cosquillas o empuje suavemente los labios del beb con el dedo o el pezn para lograr que el beb abra la boca. Acerque el beb a la mama. Asegrese de que la mayor parte de la arola se encuentre dentro de la boca del beb. Ofrzcale una mama y haga eructar al beb antes de pasar a la otra.  Hable con su mdico o asesor en lactancia si tiene dudas o problemas con la lactancia. Esta informacin no tiene Theme park managercomo fin reemplazar el consejo del mdico. Asegrese de hacerle al mdico cualquier pregunta que tenga. Document Revised: 10/28/2017 Document Reviewed: 11/23/2016 Elsevier Patient Education  2021 Arvin[employer]vier Inc.

## 2020-09-19 LAB — PROTEIN / CREATININE RATIO, URINE
Creatinine, Urine: 200.6 mg/dL
Protein, Ur: 10.8 mg/dL
Protein/Creat Ratio: 54 mg/g creat (ref 0–200)

## 2020-09-20 ENCOUNTER — Telehealth: Payer: Self-pay | Admitting: Family Medicine

## 2020-09-20 LAB — COMPREHENSIVE METABOLIC PANEL
ALT: 49 IU/L — ABNORMAL HIGH (ref 0–32)
AST: 31 IU/L (ref 0–40)
Albumin/Globulin Ratio: 1.6 (ref 1.2–2.2)
Albumin: 4.5 g/dL (ref 3.9–5.0)
Alkaline Phosphatase: 99 IU/L (ref 44–121)
BUN/Creatinine Ratio: 10 (ref 9–23)
BUN: 6 mg/dL (ref 6–20)
Bilirubin Total: 0.3 mg/dL (ref 0.0–1.2)
CO2: 18 mmol/L — ABNORMAL LOW (ref 20–29)
Calcium: 9.6 mg/dL (ref 8.7–10.2)
Chloride: 101 mmol/L (ref 96–106)
Creatinine, Ser: 0.61 mg/dL (ref 0.57–1.00)
GFR calc Af Amer: 150 mL/min/{1.73_m2} (ref >59)
GFR calc non Af Amer: 130 mL/min/{1.73_m2} (ref >59)
Globulin, Total: 2.8 g/dL (ref 1.5–4.5)
Glucose: 94 mg/dL (ref 65–99)
Potassium: 3.7 mmol/L (ref 3.5–5.2)
Sodium: 137 mmol/L (ref 134–144)
Total Protein: 7.3 g/dL (ref 6.0–8.5)

## 2020-09-20 LAB — CBC/D/PLT+RPR+RH+ABO+RUB AB...
Antibody Screen: NEGATIVE
Basophils Absolute: 0 10*3/uL (ref 0.0–0.2)
Basos: 0 %
EOS (ABSOLUTE): 0.2 10*3/uL (ref 0.0–0.4)
Eos: 2 %
Hematocrit: 44.1 % (ref 34.0–46.6)
Hemoglobin: 15.3 g/dL (ref 11.1–15.9)
Hepatitis B Surface Ag: NEGATIVE
Immature Grans (Abs): 0 10*3/uL (ref 0.0–0.1)
Immature Granulocytes: 0 %
Lymphocytes Absolute: 3.6 10*3/uL — ABNORMAL HIGH (ref 0.7–3.1)
Lymphs: 33 %
MCH: 28.9 pg (ref 26.6–33.0)
MCHC: 34.7 g/dL (ref 31.5–35.7)
MCV: 83 fL (ref 79–97)
Monocytes Absolute: 0.4 10*3/uL (ref 0.1–0.9)
Monocytes: 4 %
Neutrophils Absolute: 6.5 10*3/uL (ref 1.4–7.0)
Neutrophils: 61 %
Platelets: 335 10*3/uL (ref 150–450)
RBC: 5.29 x10E6/uL — ABNORMAL HIGH (ref 3.77–5.28)
RDW: 12.2 % (ref 11.7–15.4)
RPR Ser Ql: REACTIVE — AB
Rh Factor: POSITIVE
Rubella Antibodies, IGG: 9.19 index (ref >0.99)
Varicella zoster IgG: <135 index — ABNORMAL LOW (ref >165)
WBC: 10.8 10*3/uL (ref 3.4–10.8)

## 2020-09-20 LAB — RPR, QUANT+TP ABS (REFLEX)
Rapid Plasma Reagin, Quant: 1:1 {titer} — ABNORMAL HIGH
T Pallidum Abs: NONREACTIVE

## 2020-09-20 LAB — HEMOGLOBIN A1C
Est. average glucose Bld gHb Est-mCnc: 97 mg/dL
Hgb A1c MFr Bld: 5 % (ref 4.8–5.6)

## 2020-09-20 NOTE — Telephone Encounter (Signed)
With Spanish Interpreter Raquel M. I informed pt results and that we can f/u with her at her OB visit.  Pt verbalized understanding.   Addison Naegeli, RN  09/20/20

## 2020-09-20 NOTE — Telephone Encounter (Signed)
Please call patient and let her know that her labs came back unremarkable with exception of false positive RPR (her T pallidum Ab's were negative) and mildly elevated ALT that is likely related to obesity. We'll see her in a couple weeks for her new OB.

## 2020-09-21 ENCOUNTER — Other Ambulatory Visit: Payer: Self-pay

## 2020-09-21 ENCOUNTER — Encounter (HOSPITAL_COMMUNITY): Payer: Self-pay | Admitting: Obstetrics and Gynecology

## 2020-09-21 ENCOUNTER — Inpatient Hospital Stay (HOSPITAL_COMMUNITY): Payer: Medicaid - Out of State

## 2020-09-21 ENCOUNTER — Inpatient Hospital Stay (HOSPITAL_COMMUNITY)
Admission: AD | Admit: 2020-09-21 | Discharge: 2020-09-21 | Disposition: A | Discharge status: Home / Self Care | Payer: Medicaid - Out of State | Attending: Obstetrics and Gynecology | Admitting: Obstetrics and Gynecology

## 2020-09-21 DIAGNOSIS — R109 Unspecified abdominal pain: Secondary | ICD-10-CM

## 2020-09-21 DIAGNOSIS — R102 Pelvic and perineal pain: Secondary | ICD-10-CM | POA: Diagnosis not present

## 2020-09-21 DIAGNOSIS — O208 Other hemorrhage in early pregnancy: Secondary | ICD-10-CM | POA: Diagnosis not present

## 2020-09-21 DIAGNOSIS — Z3A11 11 weeks gestation of pregnancy: Secondary | ICD-10-CM | POA: Diagnosis not present

## 2020-09-21 DIAGNOSIS — O418X10 Other specified disorders of amniotic fluid and membranes, first trimester, not applicable or unspecified: Secondary | ICD-10-CM

## 2020-09-21 DIAGNOSIS — O468X1 Other antepartum hemorrhage, first trimester: Secondary | ICD-10-CM | POA: Diagnosis not present

## 2020-09-21 DIAGNOSIS — O161 Unspecified maternal hypertension, first trimester: Secondary | ICD-10-CM | POA: Insufficient documentation

## 2020-09-21 DIAGNOSIS — O26891 Other specified pregnancy related conditions, first trimester: Secondary | ICD-10-CM | POA: Diagnosis not present

## 2020-09-21 DIAGNOSIS — Z79899 Other long term (current) drug therapy: Secondary | ICD-10-CM | POA: Diagnosis not present

## 2020-09-21 LAB — CBC
HCT: 41.8 % (ref 36.0–46.0)
Hemoglobin: 15 g/dL (ref 12.0–15.0)
MCH: 29.9 pg (ref 26.0–34.0)
MCHC: 35.9 g/dL (ref 30.0–36.0)
MCV: 83.4 fL (ref 80.0–100.0)
Platelets: 338 10*3/uL (ref 150–400)
RBC: 5.01 MIL/uL (ref 3.87–5.11)
RDW: 12.4 % (ref 11.5–15.5)
WBC: 9.9 10*3/uL (ref 4.0–10.5)
nRBC: 0 % (ref 0.0–0.2)

## 2020-09-21 LAB — COMPREHENSIVE METABOLIC PANEL
ALT: 44 U/L (ref 0–44)
AST: 34 U/L (ref 15–41)
Albumin: 4.1 g/dL (ref 3.5–5.0)
Alkaline Phosphatase: 72 U/L (ref 38–126)
Anion gap: 10 (ref 5–15)
BUN: 5 mg/dL — ABNORMAL LOW (ref 6–20)
CO2: 22 mmol/L (ref 22–32)
Calcium: 9.2 mg/dL (ref 8.9–10.3)
Chloride: 104 mmol/L (ref 98–111)
Creatinine, Ser: 0.64 mg/dL (ref 0.44–1.00)
GFR, Estimated: >60 mL/min (ref >60)
Glucose, Bld: 114 mg/dL — ABNORMAL HIGH (ref 70–99)
Potassium: 3.8 mmol/L (ref 3.5–5.1)
Sodium: 136 mmol/L (ref 135–145)
Total Bilirubin: 0.7 mg/dL (ref 0.3–1.2)
Total Protein: 7.1 g/dL (ref 6.5–8.1)

## 2020-09-21 LAB — URINALYSIS, ROUTINE W REFLEX MICROSCOPIC
Bacteria, UA: NONE SEEN
Bilirubin Urine: NEGATIVE
Glucose, UA: NEGATIVE mg/dL
Hgb urine dipstick: NEGATIVE
Ketones, ur: 20 mg/dL — AB
Nitrite: NEGATIVE
Protein, ur: NEGATIVE mg/dL
Specific Gravity, Urine: 1.024 (ref 1.005–1.030)
pH: 5 (ref 5.0–8.0)

## 2020-09-21 LAB — CULTURE, OB URINE

## 2020-09-21 LAB — URINE CULTURE, OB REFLEX: Organism ID, Bacteria: NO GROWTH

## 2020-09-21 LAB — HCG, QUANTITATIVE, PREGNANCY: hCG, Beta Chain, Quant, S: 25962 m[IU]/mL — ABNORMAL HIGH (ref <5)

## 2020-09-21 NOTE — MAU Note (Signed)
Pt reports to mau with c/o pink dc for the past 2 days and lower abd cramping for the past week.  Pt reports she took 2 treatments of monistat last week for suspected yeast infection, but stopped on Friday because she was feeling better.  Pt denies itching or odor today.

## 2020-09-21 NOTE — MAU Provider Note (Addendum)
Patient Joyce Barnes is a 20 y.o.  G1P0  at [redacted]w[redacted]d here with complaints of abdominal pain for one week and bloody discharge in the  Morning. She denies fever, respiratory difficulties, SOB.    History     CSN: [id number]  Arrival date and time: 09/21/20 1108   None     Chief Complaint  Patient presents with  . Abdominal Pain  . Vaginal Discharge   Abdominal Pain This is a new problem. The current episode started in the past 7 days. The problem occurs intermittently. The pain is located in the suprapubic region. The pain is at a severity of 5/10. The quality of the pain is cramping. The abdominal pain does not radiate.  Vaginal Discharge The patient's primary symptoms include vaginal discharge. The current episode started in the past 7 days. The problem occurs intermittently. She is pregnant. Associated symptoms include abdominal pain. The vaginal discharge was bloody. The vaginal bleeding is spotting. She has not been passing clots. She has not been passing tissue.    OB History    Gravida  1   Para      Term      Preterm      AB      Living        SAB      IAB      Ectopic      Multiple      Live Births              Past Medical History:  Diagnosis Date  . Hypertension     Past Surgical History:  Procedure Laterality Date  . APPENDECTOMY      History reviewed. No pertinent family history.  Social History   Tobacco Use  . Smoking status: Never Smoker  . Smokeless tobacco: Never Used  Substance Use Topics  . Alcohol use: Not Currently  . Drug use: Never    Allergies: No Known Allergies  Medications Prior to Admission  Medication Sig Dispense Refill Last Dose  . NIFEdipine (PROCARDIA XL/NIFEDICAL XL) 60 MG 24 hr tablet Take 1 tablet (60 mg total) by mouth daily. 30 tablet 5 09/21/2020 at 1000    Review of Systems  Constitutional: Negative.   HENT: Negative.   Respiratory: Negative.   Gastrointestinal: Positive for abdominal pain.   Genitourinary: Positive for vaginal bleeding and vaginal discharge.  Musculoskeletal: Negative.   Neurological: Negative.   Psychiatric/Behavioral: Negative.    Physical Exam   Blood pressure (!) 160/89, pulse (!) 119, temperature 98 F (36.7 C), temperature source Oral, resp. rate 17, weight 122.7 kg, last menstrual period 07/06/2020, SpO2 100 %.  Physical Exam Constitutional:      Appearance: She is well-developed.  Abdominal:     General: Abdomen is flat.     Palpations: Abdomen is soft.  Genitourinary:    Vagina: Normal. No vaginal discharge or tenderness.     Cervix: No discharge.     Uterus: Normal.   Neurological:     Mental Status: She is alert.     MAU Course  Procedures  MDM  Due to patient's early gestation, and concern for ectopic pregnancy, will do ectopic work-up.   I have independently reviewed the [place] images, which reveal finding of fetal pole with cardiac activity, small subchorionic hemorrhage noted. No other concerns noted on imaging.  -declines wet prep and GC CT -Blood type is O pos   Patient Vitals for the past 24 hrs:  BP Temp Temp  src Pulse Resp SpO2 Weight  09/21/20 1325 (!) 160/89 - - (!) 119 17 - -  09/21/20 1137 (!) 174/102 98 F (36.7 C) Oral (!) 121 18 - 122.7 kg  09/21/20 1136 - - - - - 100 % -    -Patient's BP elevated while in MAU; she took procardia this morning at 10 am. She reports that she takes her medicine daily.  Discussed with Dr. Vergie Living, who recommends encouraging patient to take daily and have BP check in three days at [facility]. May consider increasing to 90 mgs of Procardia XL  Assessment and Plan   1. Subchorionic hemorrhage of placenta in first trimester, single or unspecified fetus   2. Abdominal pain   3. Hypertension affecting pregnancy in first trimester     2. Patient stable for discharge; reviewed subchorionic hemorrhage and pelvic rest. Keep OB appt on 10/31/2020  3. Will cancel [place] appt on 02/14; message  sent  5. Continue to take blood pressure medicine daily; message sent to make patient a nurse visit on 2/7-2/9 for BP check. Reviewed importance of blood pressure control in pregnancy. Patient verbalized understanding.  Charlesetta Garibaldi Kooistra 09/21/2020, 1:40 PM

## 2020-09-21 NOTE — Discharge Instructions (Signed)
Hematoma subcorinico Subchorionic Hematoma  Un hematoma es una acumulacin de sangre fuera de los vasos sanguneos. Un hematoma subcorinico es una acumulacin de sangre entre la pared externa del embrin (corion) y la pared interna del tero. Esta afeccin puede causar hemorragia vaginal. Generalmente, los hematomas pequeos que ocurren al principio del embarazo se reducen por su propia cuenta y no afectan al beb ni al embarazo. Cuando la hemorragia comienza ms tarde en el embarazo, o el hematoma es ms grande o se produce en una paciente de edad avanzada, la afeccin puede ser ms grave. Los hematomas ms grandes aumentan las posibilidades de aborto espontneo. Esta afeccin tambin aumenta los siguientes riesgos:  Separacin prematura de la placenta del tero.  Parto antes de trmino (prematuro).  Muerte fetal. Cules son las causas? Se desconoce la causa exacta de esta afeccin. Ocurre cuando la sangre queda atrapada entre la placenta y la pared uterina porque la placenta se ha separado del lugar original del implante. Qu incrementa el riesgo? Es ms probable que sufra esta afeccin si:  Recibi tratamiento con medicamentos para la fertilidad.  Qued embarazada a travs de la fertilizacin in vitro (FIV). Cules son los signos o sntomas? Los sntomas de esta afeccin incluyen:  Prdida o hemorragia vaginal.  Dolor abdominal. Esto es poco frecuente. En ocasiones, puede no haber sntomas y la hemorragia solo se puede ver cuando se toman imgenes ecogrficas (ecografa transvaginal). Cmo se diagnostica? Esta afeccin se diagnostica en funcin de un examen fsico. Es un examen plvico. Tambin pueden hacerle otras pruebas, incluidas las siguientes:  Anlisis de sangre.  Anlisis de orina.  Ecografa del abdomen. Cmo se trata? El tratamiento de esta afeccin puede variar. El tratamiento puede incluir:  Observacin cautelosa. La observarn atentamente para detectar  cualquier cambio en la hemorragia.  Medicamentos.  Restriccin de las actividades. Puede ser necesaria hasta que se detenga la hemorragia.  Un medicamento llamado inmunoglobulina Rh. Este se da si usted tiene un grupo sanguneo Rh negativo. Previene la sensibilizacin al Rh. Siga estas instrucciones en su casa:  Haga reposo en cama si se lo indica el mdico.  No levante objetos que pesen ms de 10libras (4.5kg) o el lmite que le haya indicado el mdico.  Lleve un registro escrito de la cantidad de toallas higinicas que utiliza cada da y cun empapadas (saturadas) estn.  No use tampones.  Cumpla con todas las visitas de seguimiento. Esto es importante. El profesional podr pedirle que se realice anlisis de seguimiento, ecografas o ambas. Comunquese con un mdico si:  Tiene una hemorragia vaginal.  Tiene fiebre. Solicite ayuda de inmediato si:  Siente calambres intensos en el estmago, en la espalda, en el abdomen o en la pelvis.  Elimina cogulos o tejidos grandes. Guarde los tejidos para que su mdico los vea.  Se desmaya.  Se siente mareada o dbil. Resumen  Un hematoma subcorinico es una acumulacin de sangre entre la pared externa del embrin (corion) y la pared interna del tero.  Esta afeccin puede causar hemorragia vaginal.  En ocasiones, puede no haber sntomas y la hemorragia solo se puede ver cuando se toman imgenes ecogrficas.  El tratamiento puede incluir una observacin cautelosa, medicamentos o restriccin de las actividades.  Cumpla con todas las visitas de seguimiento. Solicite ayuda de inmediato si tiene clicos fuertes o una hemorragia vaginal intensa. Esta informacin no tiene como fin reemplazar el consejo del mdico. Asegrese de hacerle al mdico cualquier pregunta que tenga. Document Revised: 06/14/2020 Document Reviewed: 06/14/2020 Elsevier   Elsevier Patient Education  2021 Elsevier Inc.   

## 2020-09-25 ENCOUNTER — Ambulatory Visit (INDEPENDENT_AMBULATORY_CARE_PROVIDER_SITE_OTHER): Payer: Self-pay | Admitting: *Deleted

## 2020-09-25 ENCOUNTER — Other Ambulatory Visit: Payer: Self-pay

## 2020-09-25 ENCOUNTER — Encounter: Payer: Medicaid - Out of State | Admitting: Family Medicine

## 2020-09-25 VITALS — BP 129/71 | HR 106 | Ht 66.0 in | Wt 266.9 lb

## 2020-09-25 DIAGNOSIS — O10919 Unspecified pre-existing hypertension complicating pregnancy, unspecified trimester: Secondary | ICD-10-CM

## 2020-09-25 DIAGNOSIS — O0990 Supervision of high risk pregnancy, unspecified, unspecified trimester: Secondary | ICD-10-CM

## 2020-09-25 NOTE — Progress Notes (Signed)
Chart reviewed for nurse visit. Agree with plan of care.   Venora Maples, MD 09/25/20 10:11 AM

## 2020-09-25 NOTE — Progress Notes (Signed)
Here for bp check due to early pregnant with HTN. States started procardia 60 mg XL( taking two of the 30 mg tablets )  on 09/18/20. BP 151/ 83. Denies headaches, visual disturbances, or edema. States nervous because always nervous at doctors office. BP repeated 149 84.  BP and assessment reported to Dr. Crissie Reese. Orders to repeat bp and if either number 150/90 or higher to increase dose to 90.  Bp in one-two weeks. 3rd bp wnl at 129/71. Advised to continue Procardia 60 daily as ordered. bp   Schedule bp Check one week. Also advised to pick up Procardia 60 mg XL RX to have when runs out of 30 mg tablets. Clarified dosage. Linda,RN

## 2020-09-30 ENCOUNTER — Ambulatory Visit: Payer: Medicaid - Out of State

## 2020-10-02 ENCOUNTER — Ambulatory Visit (INDEPENDENT_AMBULATORY_CARE_PROVIDER_SITE_OTHER): Payer: Self-pay

## 2020-10-02 ENCOUNTER — Other Ambulatory Visit: Payer: Self-pay

## 2020-10-02 ENCOUNTER — Other Ambulatory Visit (HOSPITAL_COMMUNITY)
Admission: RE | Admit: 2020-10-02 | Discharge: 2020-10-02 | Disposition: A | Discharge status: Home / Self Care | Payer: Self-pay | Source: Ambulatory Visit | Attending: Family Medicine | Admitting: Family Medicine

## 2020-10-02 VITALS — BP 141/93 | HR 110 | Wt 263.4 lb

## 2020-10-02 DIAGNOSIS — Z0130 Encounter for examination of blood pressure without abnormal findings: Secondary | ICD-10-CM

## 2020-10-02 DIAGNOSIS — N949 Unspecified condition associated with female genital organs and menstrual cycle: Secondary | ICD-10-CM | POA: Insufficient documentation

## 2020-10-02 DIAGNOSIS — N9489 Other specified conditions associated with female genital organs and menstrual cycle: Secondary | ICD-10-CM

## 2020-10-02 NOTE — Progress Notes (Signed)
Chart reviewed for nurse visit. Agree with plan of care.   Venora Maples, MD 10/02/20 12:15 PM

## 2020-10-02 NOTE — Progress Notes (Signed)
Pt here today for BP check. Pt is [redacted]w[redacted]d pregnant with history of chronic hypertension. Began nifedipine 60 mg daily following initial appt on 09/18/20. BP today reviewed with Crissie Reese, MD who states pt may continue on current dosage of nifedipine. Pt to follow up at appt 10/31/20.  Pt complains of vaginal burning, denies any abnormal discharge. Pt agreeable to vaginal swab today. Self-swab instructions given and specimen obtained. Pt will be notified with any abnormal results.  Interpreter Eda royal present for entire encounter.   Fleet Contras RN 10/02/20

## 2020-10-03 ENCOUNTER — Other Ambulatory Visit: Payer: Self-pay | Admitting: Family Medicine

## 2020-10-03 LAB — CERVICOVAGINAL ANCILLARY ONLY
Bacterial Vaginitis (gardnerella): POSITIVE — AB
Candida Glabrata: NEGATIVE
Candida Vaginitis: NEGATIVE
Chlamydia: NEGATIVE
Comment: NEGATIVE
Comment: NEGATIVE
Comment: NEGATIVE
Comment: NEGATIVE
Comment: NEGATIVE
Comment: NORMAL
Neisseria Gonorrhea: NEGATIVE
Trichomonas: NEGATIVE

## 2020-10-03 MED ORDER — METRONIDAZOLE 500 MG PO TABS: 500.0000 mg | ORAL_TABLET | Freq: Two times a day (BID) | ORAL | 0 refills | Status: AC

## 2020-10-03 NOTE — Progress Notes (Signed)
bv treatment 

## 2020-10-16 ENCOUNTER — Encounter: Payer: Medicaid - Out of State | Admitting: Obstetrics and Gynecology

## 2020-10-17 ENCOUNTER — Inpatient Hospital Stay (HOSPITAL_COMMUNITY)
Admission: AD | Admit: 2020-10-17 | Discharge: 2020-10-17 | Disposition: A | Discharge status: Home / Self Care | Payer: Self-pay | Attending: Obstetrics & Gynecology | Admitting: Obstetrics & Gynecology

## 2020-10-17 ENCOUNTER — Encounter (HOSPITAL_COMMUNITY): Payer: Self-pay | Admitting: Obstetrics & Gynecology

## 2020-10-17 ENCOUNTER — Other Ambulatory Visit: Payer: Self-pay

## 2020-10-17 DIAGNOSIS — R109 Unspecified abdominal pain: Secondary | ICD-10-CM | POA: Insufficient documentation

## 2020-10-17 DIAGNOSIS — O10911 Unspecified pre-existing hypertension complicating pregnancy, first trimester: Secondary | ICD-10-CM | POA: Insufficient documentation

## 2020-10-17 DIAGNOSIS — A084 Viral intestinal infection, unspecified: Secondary | ICD-10-CM | POA: Insufficient documentation

## 2020-10-17 DIAGNOSIS — O99891 Other specified diseases and conditions complicating pregnancy: Secondary | ICD-10-CM

## 2020-10-17 DIAGNOSIS — O26891 Other specified pregnancy related conditions, first trimester: Secondary | ICD-10-CM | POA: Insufficient documentation

## 2020-10-17 DIAGNOSIS — O98511 Other viral diseases complicating pregnancy, first trimester: Secondary | ICD-10-CM | POA: Insufficient documentation

## 2020-10-17 DIAGNOSIS — Z79899 Other long term (current) drug therapy: Secondary | ICD-10-CM | POA: Insufficient documentation

## 2020-10-17 DIAGNOSIS — Z3A09 9 weeks gestation of pregnancy: Secondary | ICD-10-CM | POA: Insufficient documentation

## 2020-10-17 LAB — URINALYSIS, ROUTINE W REFLEX MICROSCOPIC
Bilirubin Urine: NEGATIVE
Glucose, UA: NEGATIVE mg/dL
Hgb urine dipstick: NEGATIVE
Ketones, ur: NEGATIVE mg/dL
Leukocytes,Ua: NEGATIVE
Nitrite: NEGATIVE
Protein, ur: NEGATIVE mg/dL
Specific Gravity, Urine: 1.016 (ref 1.005–1.030)
pH: 5 (ref 5.0–8.0)

## 2020-10-17 NOTE — MAU Note (Signed)
Presents with c/o abdominal pain that began yesterday, reports pain is located in the entire left abdomen.  States hasn't taken any meds for pain.  Denies VB.

## 2020-10-17 NOTE — MAU Provider Note (Signed)
History    CSN: [id number]  Arrival date and time: 10/17/20 0856   Chief Complaint  Patient presents with  . Abdominal Pain   HPI: Ms. Joyce Barnes is a 20 y/o female G1P0 presenting to the MAU at 9 weeks 4 days with new onset abdominal pain. She states that the pain was sudden onset with associated cramping, and was woken from sleep at 3:00am with diarrhea. She ate a dish prepared by a relative containing rice and spaghetti last night, and the pain and diarrhea occurred several hours later. The cramping is a 3/10 and has been waxing and waning. There is diffuse abdominal tenderness with a focal area in the LLQ. She denies any spotting, vaginal discharge, burning with urination, dysuria, straining, melena, hematochezia, fever, or chills. She endorses nausea, but denies vomiting. She has not attempted any interventions.    OB History    Gravida  1   Para      Term      Preterm      AB      Living        SAB      IAB      Ectopic      Multiple      Live Births              Past Medical History:  Diagnosis Date  . Hypertension     Past Surgical History:  Procedure Laterality Date  . APPENDECTOMY      Family History  Problem Relation Age of Onset  . Healthy Mother   . Diabetes Father   . Cancer Maternal Aunt     Social History   Tobacco Use  . Smoking status: Never Smoker  . Smokeless tobacco: Never Used  Vaping Use  . Vaping Use: Never used  Substance Use Topics  . Alcohol use: Not Currently  . Drug use: Never    Allergies: No Known Allergies  Medications Prior to Admission  Medication Sig Dispense Refill Last Dose  . NIFEdipine (PROCARDIA XL/NIFEDICAL XL) 60 MG 24 hr tablet Take 1 tablet (60 mg total) by mouth daily. 30 tablet 5 10/17/2020 at 0700  . Prenatal Vit-Fe Fumarate-FA (PRENATAL VITAMINS PO) Take 1 tablet by mouth daily.   10/16/2020 at 1200    Review of Systems  Constitutional: Positive for appetite change. Negative for activity  change, chills and fever.  HENT: Negative for congestion, rhinorrhea, sore throat and trouble swallowing.   Eyes: Negative.  Negative for photophobia and visual disturbance.  Respiratory: Negative for cough, chest tightness and shortness of breath.   Cardiovascular: Negative for chest pain and leg swelling.  Gastrointestinal: Positive for abdominal pain, diarrhea and nausea. Negative for abdominal distention, blood in stool, constipation and vomiting.  Genitourinary: Negative for difficulty urinating, dysuria, hematuria, pelvic pain, vaginal bleeding, vaginal discharge and vaginal pain.  Neurological: Negative for dizziness, syncope and headaches.  Psychiatric/Behavioral: Negative.    Physical Exam   Blood pressure (!) 161/92, pulse (!) 126, temperature 98.8 F (37.1 C), resp. rate 18, last menstrual period 07/06/2020, SpO2 100 %.  Physical Exam Constitutional:      Appearance: She is well-developed.  HENT:     Head: Normocephalic and atraumatic.  Eyes:     Extraocular Movements: Extraocular movements intact.     Pupils: Pupils are equal, round, and reactive to light.  Cardiovascular:     Rate and Rhythm: Normal rate and regular rhythm.     Heart sounds: Normal heart sounds. No  murmur heard. No friction rub. No gallop.   Pulmonary:     Effort: Pulmonary effort is normal.     Breath sounds: Normal breath sounds.  Abdominal:     General: Bowel sounds are normal. There is no distension.     Palpations: Abdomen is soft.     Tenderness: There is generalized abdominal tenderness and tenderness in the left lower quadrant.  Skin:    General: Skin is warm and dry.     Capillary Refill: Capillary refill takes less than 2 seconds.  Neurological:     General: No focal deficit present.     Mental Status: She is alert and oriented to person, place, and time.  Psychiatric:        Mood and Affect: Mood normal.        Behavior: Behavior normal.     MAU Course  Procedures POCUS  abdominal  - Bedside ultrasound allowed visualization of an intact IUP with a fetal heart rate of ~160  - No complications noted   MDM H&P type: Detail Medical decision making: Low complexity Time spent with patient: Less than 30 minutes  Assessment and Plan  1. Nulligravida pregnancy 9wk4d  - IUP and fetal heartbeat visualized with bedside ultrasound, FHR ~160 -Continue taking prenatal vitamins and presenting for regular prenatal appointments.  -Present back to the MAU with intense abdominal pain, vaginal bleeding, headaches, blurry vision, chest pain, or shortness of breath  2. Chronic hypertension - BP elevated in MAU today -Continue taking nifedipine  -Will need to monitor BP at home during pregnancy and upload recordings to [employer]  3. Viral gastroenteritis  -Clinical diagnosis due to ingestion of new food and then sudden onset abdominal pain, nausea, and diarrhea.  -Continue drinking fluids and maintain a bland diet until nausea and diarrhea improve   Gillermo Murdoch, PA-S 10/17/2020, 10:21 AM

## 2020-10-17 NOTE — Discharge Instructions (Signed)
Gastroenteritis viral en los adultos Viral Gastroenteritis, Adult  La gastroenteritis viral tambin se conoce como gripe estomacal. Esta afeccin podra afectar el estmago, el intestino delgado y el intestino grueso. Puede causar Scherrie Bateman, fiebre y vmitos repentinos. Esta afeccin es causada por muchos virus diferentes. Estos virus pueden transmitirse de [name] persona a otra con mucha facilidad (son contagiosos). La diarrea y los vmitos pueden hacerlo sentir dbil y causar deshidratacin. Es posible que no pueda [employer]. La deshidratacin puede causarle cansancio, sed, sequedad en la boca y disminucin en la frecuencia con la que orina. Es importante restituir los lquidos que pierde por causa de la diarrea y los vmitos. Cules son las causas? La gastroenteritis es causada por muchos virus, entre los que se incluyen el rotavirus y el norovirus. El norovirus es la causa ms frecuente en los adultos. Puede enfermarse despus de estar expuesto a los virus de [employer]ist. Tambin puede enfermarse de las siguientes maneras:  A travs de la ingesta de alimentos o agua contaminados, o por tocar superficies contaminadas con alguno de estos virus.  Al compartir utensilios u otros artculos personales con una persona infectada. Qu incrementa el riesgo? Es ms probable que tenga esta afeccin si:  Tiene debilitado el sistema de defensa del organismo (sistema inmunitario).  Viven con uno o ms nios menores de 2aos.  Vive en un hogar de ancianos.  Viaja en un crucero. Cules son los signos o los sntomas? Los sntomas de esta afeccin suelen [employer] 1 y 3das despus de la exposicin al virus. Pueden durar [employer] o incluso [place]. Los sntomas frecuentes son Barnett Hatter lquida y vmitos. Otros sntomas pueden incluir los siguientes:  Teacher, English as a foreign language.  Dolor de [place].  Fatiga.  Dolor en el abdomen.  Escalofros.  Debilidad.  Nuseas.  Dolores  musculares.  Prdida del apetito. Cmo se diagnostica? Esta afeccin se diagnostica mediante una revisin de los antecedentes mdicos y un examen fsico. Tambin podran hacerle un anlisis de materia fecal para detectar virus u otras infecciones. Cmo se trata? Por lo general, esta afeccin desaparece por s sola. El tratamiento se centra en prevenir la deshidratacin y reponer los lquidos perdidos (rehidratacin). El tratamiento de esta afeccin puede incluir:  Una solucin de rehidratacin oral (SRO) para Surveyor, quantity y Energy manager (electrolitos) importantes en el cuerpo. Tmela si se lo indic el mdico. Esta es una bebida que se vende en farmacias y tiendas minoristas.  Medicamentos para [employer].  Suplementos probiticos para disminuir los sntomas de diarrea.  Administracin de lquidos por va intravenosa si la deshidratacin es grave. Los [employer] y las personas que tienen otras enfermedades o un sistema inmunitario dbil estn en mayor riesgo de deshidratacin. Siga estas instrucciones en su casa: Comida y bebida  Tomar una SRO como se lo haya indicado el mdico.  En la medida en que pueda, beba lquidos claros en pequeas cantidades. Los lquidos transparentes son, por ejemplo: ? Westley Hummer. ? Trocitos de hielo. ? Jugo de frutas diluido. ? Bebidas deportivas de bajas caloras.  Beba suficiente lquido como para Pharmacologist la orina de color amarillo plido.  Coma pequeas cantidades de alimentos saludables cada 3 a 4 horas segn su tolerancia. Estos pueden incluir cereales integrales, frutas, verduras, carnes magras y yogur.  Evite consumir lquidos que contengan mucha azcar o cafena, como bebidas energticas, bebidas deportivas y refrescos.  Evite los alimentos condimentados o con alto contenido de [employer].  Evite tomar alcohol.   Instrucciones generales  Lvese las manos  con frecuencia, especialmente despus de tener diarrea o vmitos. Use  desinfectante para manos si no dispone de [place] y [place].  Asegrese de que todas las personas que viven en su casa se laven bien las manos y con frecuencia.  Tome los medicamentos de venta libre y los recetados solamente como se lo haya indicado el mdico.  Descanse en su casa mientras se recupera.  Controle su afeccin para Insurance risk surveyor cambio.  Tome un bao caliente para ayudar a Engineer, manufacturing systems ardor o el dolor causados por los episodios frecuentes de diarrea.  Concurra a todas las visitas de [address] se lo haya indicado el mdico. Esto es importante.   Comunquese con un mdico si:  No retiene los lquidos.  Tiene sntomas que empeoran.  Tiene nuevos sntomas.  Se siente mareado o siente que va a desvanecerse.  Presenta calambres musculares. Solicite ayuda inmediatamente si:  Midwife.  Se siente muy dbil o se desmaya.  Observa sangre en el vmito.  Tiene vmito que se asemeja al poso del caf.  Tiene heces con sangre, de color negro, o con aspecto alquitranado.  Siente dolor de cabeza intenso, rigidez en el cuello, o ambas cosas.  Tiene una erupcin cutnea.  Tiene dolor intenso, clicos o distensin en el abdomen.  Tiene problemas para respirar o respira muy rpidamente.  Tiene latidos cardacos acelerados.  Tiene la piel fra y hmeda.  Se siente confundido.  Tiene dolor al [employer].  Tiene signos de deshidratacin, como los siguientes: ? Orina de color oscuro, muy escasa o falta de [place]. ? Labios agrietados. ? Sequedad de boca. ? Ojos hundidos. ? Somnolencia. ? Debilidad. Resumen  La gastroenteritis viral tambin se conoce como gripe estomacal. Puede causar diarrea lquida, fiebre y vmitos repentinos.  Esta afeccin se puede transmitir de [place] persona a otra con mucha facilidad (es contagiosa).  Tome una SRO si se lo indic el mdico. Esta es una bebida que se vende en farmacias y tiendas minoristas.  Lvese las  manos con frecuencia, especialmente despus de tener diarrea o vmitos. Use desinfectante para manos si no dispone de [place] y [place]. Esta informacin no tiene Theme park manager el consejo del mdico. Asegrese de hacerle al mdico cualquier pregunta que tenga. Document Revised: 07/15/2018 Document Reviewed: 07/15/2018 Elsevier Patient Education  2021 [employer].

## 2020-10-31 ENCOUNTER — Ambulatory Visit (INDEPENDENT_AMBULATORY_CARE_PROVIDER_SITE_OTHER): Payer: Medicaid - Out of State | Admitting: Obstetrics and Gynecology

## 2020-10-31 ENCOUNTER — Other Ambulatory Visit: Payer: Self-pay

## 2020-10-31 VITALS — BP 164/99 | HR 135 | Wt 261.2 lb

## 2020-10-31 DIAGNOSIS — Z3A11 11 weeks gestation of pregnancy: Secondary | ICD-10-CM

## 2020-10-31 DIAGNOSIS — O10919 Unspecified pre-existing hypertension complicating pregnancy, unspecified trimester: Secondary | ICD-10-CM

## 2020-10-31 DIAGNOSIS — O0990 Supervision of high risk pregnancy, unspecified, unspecified trimester: Secondary | ICD-10-CM

## 2020-10-31 MED ORDER — LABETALOL HCL 200 MG PO TABS
200.0000 mg | ORAL_TABLET | Freq: Two times a day (BID) | ORAL | 3 refills | Status: DC
Start: 2020-10-31 — End: 2021-01-06

## 2020-10-31 NOTE — Progress Notes (Signed)
Bp elevated. Patient confirms is taking procardia daily. Denies headaches, edema. Given bp cuff with instructions today. Panorama drawn. Alandra Sando,RN

## 2020-10-31 NOTE — Progress Notes (Signed)
   PRENATAL VISIT NOTE  Subjective:  Joyce Barnes is a 20 y.o. G1P0 at [redacted]w[redacted]d being seen today for ongoing prenatal care.  She is currently monitored for the following issues for this high-risk pregnancy and has Supervision of high risk pregnancy, antepartum; Chronic hypertension affecting pregnancy; and [redacted] weeks gestation of pregnancy on their problem list.  Patient doing well with no acute concerns today. She reports no complaints.  Contractions: Not present. Vag. Bleeding: None.  Movement: Absent. Denies leaking of fluid.   Pt notes compliance with procardia, but she still has elevated blood  The following portions of the patient's history were reviewed and updated as appropriate: allergies, current medications, past family history, past medical history, past social history, past surgical history and problem list. Problem list updated.  Objective:   Vitals:   10/31/20 1114 10/31/20 1119  BP: (!) 149/99 (!) 164/99  Pulse: (!) 136 (!) 135  Weight: 261 lb 3.2 oz (118.5 kg)     Fetal Status: Fetal Heart Rate (bpm): 159   Movement: Absent     General:  Alert, oriented and cooperative. Patient is in no acute distress.  Skin: Skin is warm and dry. No rash noted.   Cardiovascular: Normal heart rate noted  Respiratory: Normal respiratory effort, no problems with respiration noted  Abdomen: Soft, gravid, appropriate for gestational age.  Pain/Pressure: Absent     Pelvic: Cervical exam deferred        Extremities: Normal range of motion.  Edema: None  Mental Status:  Normal mood and affect. Normal behavior. Normal judgment and thought content.   Assessment and Plan:  Pregnancy: G1P0 at [redacted]w[redacted]d  1. Supervision of high risk pregnancy, antepartum Schedule anatomy scan at 18-20 weeks - Genetic Screening  2. Chronic hypertension affecting pregnancy Continue with procardia 60 mg, but add labetalol for coverage Will give home blood pressure cuff and advise pt to monitor every few  days - labetalol (NORMODYNE) 200 MG tablet; Take 1 tablet (200 mg total) by mouth 2 (two) times daily.  Dispense: 60 tablet; Refill: 3  3. [redacted] weeks gestation of pregnancy   Preterm labor symptoms and general obstetric precautions including but not limited to vaginal bleeding, contractions, leaking of fluid and fetal movement were reviewed in detail with the patient.  Please refer to After Visit Summary for other counseling recommendations.   Return in about 3 weeks (around 11/21/2020) for [facility], in person.   Mariel Aloe, MD Faculty Attending Center for [facility]

## 2020-10-31 NOTE — Patient Instructions (Signed)
Hipertensin durante el embarazo Hypertension During Pregnancy La hipertensin tambin se denomina presin arterial alta. La presin arterial alta significa que la fuerza de la sangre que se mueve por su cuerpo es lo suficientemente alta como para causarle problemas a usted y al beb. Durante el embarazo pueden producirse diferentes tipos de presin arterial alta. Los tipos son los siguientes:  Presin arterial alta antes de quedar embarazada. Esto se denomina hipertensin crnica..  Puede continuar durante el embarazo. El mdico querr continuar controlndole la presin arterial. Es posible que necesite medicamentos para controlar la presin arterial mientras est embarazada. Deber concurrir a visitas de seguimiento despus de haber tenido el beb.  Presin arterial alta que sube durante el embarazo cuando antes era normal. Esto se denomina hipertensin gestacional. Por lo general, mejorar despus de que tenga el beb, pero el mdico deber controlarle la presin arterial para asegurarse de que est mejorando.  Puede desarrollar presin arterial alta despus del parto. Esto se denomina hipertensin posparto. Suele ocurrir [employer] de las 48 horas despus del nacimiento del beb, pero puede aparecer [employer] despus de dar a luz. La presin arterial muy alta durante el embarazo es una emergencia que necesita tratamiento de inmediato. Jill Alexanders modo me afecta? Si tiene presin arterial alta durante el embarazo, tiene ms probabilidades de desarrollar presin arterial alta:  A medida que envejece.  Si queda embarazada nuevamente. En algunos casos, la presin arterial alta durante el embarazo puede causar lo siguiente:  Accidente cerebrovascular.  Infarto de miocardio.  Dao [employer] riones, los pulmones o el hgado.  Preeclampsia.  Sndrome de HELLP.  Convulsiones.  Problemas con la placenta. Cmo afecta esto al beb? El beb puede:  Nacer antes de tiempo.  No pesar tanto como  debera.  No tolerar bien el trabajo de [place], lo que conduce a un parto por cesrea. Esta afeccin tambin puede provocar la muerte del beb antes del nacimiento (muerte fetal). Cules son los riesgos?  Haber tenido presin arterial alta durante un embarazo anterior.  Tener sobrepeso.  Tener 35aos o ms.  Estar embarazada por primera vez.  Estar embarazada de ms de un beb.  Embarazarse a travs de mtodos de fertilizacin como fertilizacin in vitro (FIV).  Tener otros problemas, como diabetes o enfermedad renal. Qu puedo hacer para reducir mi riesgo?  Mantenga un peso saludable.  Siga una dieta saludable.  Siga las indicaciones del mdico acerca de tratar cualquier problema mdico que tenga antes de quedar embarazada. Es muy importante concurrir a todas las visitas al [employer]. El mdico verificar su presin arterial y se asegurar de que el embarazo progrese como corresponde. El tratamiento debe comenzar con prontitud si se detecta un problema.   Cmo se trata? El tratamiento para la presin arterial alta durante el embarazo puede variar. Depende del tipo de presin arterial alta que tenga y de lo grave que sea.  Si estaba recibiendo un medicamento para la presin arterial antes de quedar embarazada, hable con el mdico. Es posible que tenga que cambiar el medicamento durante el embarazo si no es seguro para su beb.  Si su presin arterial sube durante el embarazo, el mdico puede indicarle un medicamento para tratarla.  Si est en riesgo de tener preeclampsia, el mdico puede pedirle que tome una dosis baja de aspirina mientras est embarazada.  Si tiene presin arterial muy alta, es posible que Hydrologist en el hospital para que usted y el beb puedan ser vigilados de cerca. Es posible que deba tomar  medicamentos para disminuir la presin arterial.  En ciertos casos, si su afeccin empeora, es posible que necesite dar a luz prematuramente. Siga estas  instrucciones en su casa: Comida y bebida  Beba suficiente lquido para mantener el pis (orina) de color amarillo plido.  Evite la cafena.   Estilo de vida  No fume ni consuma ningn producto que contenga nicotina o tabaco. Si necesita ayuda para dejar de fumar, consulte al mdico.  No consuma drogas ni alcohol.  Evite el estrs.  Haga reposo y [place].  La actividad fsica regular puede ayudar. Consulte al mdico qu tipos de ejercicios son mejores para usted. Instrucciones generales  Use los medicamentos de venta libre y los recetados solamente como se lo haya indicado el mdico.  Concurra a todas las visitas prenatales y de seguimiento. Comunquese con un mdico si:  Tiene sntomas a los que su mdico le indic que debera estar [place], tales como: ? Dolores de [place]. ? Ganas de vomitar (nuseas). ? Vmitos. ? Dolor en el vientre (abdominal). ? Sentirse mareada o aturdida. Solicite ayuda de inmediato si:  Tiene sntomas de problemas graves, como: ? Dolor muy intenso en el vientre que no mejora con el [place]. ? Dolor de cabeza muy intenso que no mejora. ? Visin borrosa. ? Visin doble. ? Vmitos que no mejoran. ? Aumenta de peso de forma rpida y repentina. ? Hinchazn repentina en las manos, los tobillos o el rostro. ? Hemorragia vaginal. ? Sangre en la orina. ? Falta de aire. ? Dolor de pecho. ? Debilidad en un lado del cuerpo. ? Dificultad para hablar.  El beb no se mueve tanto como sera usual. Estos sntomas pueden indicar Radio broadcast assistant. Solicite ayuda de inmediato. Comunquese con el servicio de emergencias de su localidad (911 en los [place]).  No espere a ver si los sntomas desaparecen.  No conduzca por sus propios medios [employer] hospital. Resumen  La presin arterial alta tambin se denomina hipertensin.  La presin arterial alta significa que la fuerza de la sangre que se mueve por su cuerpo es lo suficientemente alta como  para causarle problemas a usted y al beb.  Busque ayuda de inmediato si tiene sntomas de problemas graves debidos a la presin arterial alta.  Concurra a todas las visitas prenatales y de seguimiento. Esta informacin no tiene Theme park manager el consejo del mdico. Asegrese de hacerle al mdico cualquier pregunta que tenga. Document Revised: 06/14/2020 Document Reviewed: 05/14/2020 Elsevier Patient Education  2021 [employer].

## 2020-11-05 ENCOUNTER — Ambulatory Visit (INDEPENDENT_AMBULATORY_CARE_PROVIDER_SITE_OTHER): Payer: Self-pay | Admitting: Obstetrics and Gynecology

## 2020-11-05 ENCOUNTER — Other Ambulatory Visit: Payer: Self-pay

## 2020-11-05 ENCOUNTER — Telehealth: Payer: Self-pay | Admitting: Family Medicine

## 2020-11-05 ENCOUNTER — Encounter: Payer: Self-pay | Admitting: Obstetrics and Gynecology

## 2020-11-05 VITALS — BP 153/104 | HR 123 | Wt 261.0 lb

## 2020-11-05 DIAGNOSIS — O0990 Supervision of high risk pregnancy, unspecified, unspecified trimester: Secondary | ICD-10-CM

## 2020-11-05 DIAGNOSIS — O10919 Unspecified pre-existing hypertension complicating pregnancy, unspecified trimester: Secondary | ICD-10-CM

## 2020-11-05 DIAGNOSIS — Z3A12 12 weeks gestation of pregnancy: Secondary | ICD-10-CM

## 2020-11-05 MED ORDER — DOXYLAMINE SUCCINATE (SLEEP) 25 MG PO TABS: 25.0000 mg | ORAL_TABLET | Freq: Four times a day (QID) | ORAL | 2 refills | Status: DC | PRN

## 2020-11-05 NOTE — Telephone Encounter (Signed)
Spanish - pt called and stated she is having a lot of abd pain. Pt is preg

## 2020-11-05 NOTE — Telephone Encounter (Signed)
Called pt with interpreter Raquel. Pt reports continuous abdominal pain since 7PM last night. Pain has decreased from 9/10 to 6/10 pain overnight. Reports last BM this AM; no relief in pain with BM. Endorses loose stool, this is normal for pt. Denies N/V. Denies vaginal bleeding. Same day appt scheduled with Myriam Jacobson, MD.

## 2020-11-05 NOTE — Progress Notes (Signed)
   PRENATAL VISIT NOTE  Subjective:  Joyce Barnes is a 20 y.o. G1P0000 at [redacted]w[redacted]d being seen today for ongoing prenatal care.  She is currently monitored for the following issues for this high-risk pregnancy and has Supervision of high risk pregnancy, antepartum; Chronic hypertension affecting pregnancy; and [redacted] weeks gestation of pregnancy on their problem list.  Patient reports Abdominal pain that was bad yesterday but is improved today. Reports loose stool but this is her baseline. Denies vomiting but endorsed some mild nausea that is ongoing with pregnancy. Curious about her panorama results.  Contractions: Not present. Vag. Bleeding: None.  Movement: Absent. Denies leaking of fluid.   Patient confused about BP medications. Did not know she should  Be taking both. Took labetalol 200mg  this morning. Not routinely checking blood pressure at home.   The following portions of the patient's history were reviewed and updated as appropriate: allergies, current medications, past family history, past medical history, past social history, past surgical history and problem list.   Objective:   Vitals:   11/05/20 1113  BP: (!) 153/104  Pulse: (!) 123  Weight: 261 lb (118.4 kg)    Fetal Status: Fetal Heart Rate (bpm): 160   Movement: Absent     General:  Alert, oriented and cooperative. Patient is in no acute distress.  Skin: Skin is warm and dry. No rash noted.   Cardiovascular: Normal heart rate noted  Respiratory: Normal respiratory effort, no problems with respiration noted  Abdomen: Soft, gravid, appropriate for gestational age.Nontender to palpation.  Pain/Pressure: Present     Pelvic: Cervical exam deferred        Extremities: Normal range of motion.  Edema: Trace  Mental Status: Normal mood and affect. Normal behavior. Normal judgment and thought content.   Assessment and Plan:  Pregnancy: G1P0000 at [redacted]w[redacted]d 1. Supervision of high risk pregnancy, antepartum -appointment made for  today for ?abdominal pain. This is resolved. Mainly patient curious about panorama screening and wants to see baby. Discussed anatomy scan and that results for panorama are not back.   2. Chronic hypertension affecting pregnancy BP 153/104 today. Only took labetalol 200mg  this morning, has not been taking procardia. Educated on BP regimen and importance of BP control. Will plan for her to keep appointment on 4/7 to check BP again.   3. [redacted] weeks gestation of pregnancy     Preterm labor symptoms and general obstetric precautions including but not limited to vaginal bleeding, contractions, leaking of fluid and fetal movement were reviewed in detail with the patient. Please refer to After Visit Summary for other counseling recommendations.   No follow-ups on file.  Future Appointments  Date Time Provider Department Center  11/21/2020  9:55 AM 6/7, MD [facility]    [facility], MD

## 2020-11-05 NOTE — Patient Instructions (Signed)

## 2020-11-19 ENCOUNTER — Encounter: Payer: Self-pay | Admitting: *Deleted

## 2020-11-21 ENCOUNTER — Encounter: Payer: Self-pay | Admitting: *Deleted

## 2020-11-21 ENCOUNTER — Other Ambulatory Visit: Payer: Self-pay

## 2020-11-21 ENCOUNTER — Ambulatory Visit (INDEPENDENT_AMBULATORY_CARE_PROVIDER_SITE_OTHER): Payer: Self-pay | Admitting: Obstetrics and Gynecology

## 2020-11-21 ENCOUNTER — Encounter: Payer: Self-pay | Admitting: Obstetrics and Gynecology

## 2020-11-21 VITALS — BP 131/86 | HR 90 | Wt 260.0 lb

## 2020-11-21 DIAGNOSIS — R768 Other specified abnormal immunological findings in serum: Secondary | ICD-10-CM | POA: Insufficient documentation

## 2020-11-21 DIAGNOSIS — R600 Localized edema: Secondary | ICD-10-CM

## 2020-11-21 DIAGNOSIS — Z758 Other problems related to medical facilities and other health care: Secondary | ICD-10-CM | POA: Insufficient documentation

## 2020-11-21 DIAGNOSIS — Z789 Other specified health status: Secondary | ICD-10-CM

## 2020-11-21 DIAGNOSIS — O99210 Obesity complicating pregnancy, unspecified trimester: Secondary | ICD-10-CM

## 2020-11-21 DIAGNOSIS — Z6841 Body Mass Index (BMI) 40.0 and over, adult: Secondary | ICD-10-CM | POA: Insufficient documentation

## 2020-11-21 DIAGNOSIS — O0990 Supervision of high risk pregnancy, unspecified, unspecified trimester: Secondary | ICD-10-CM

## 2020-11-21 DIAGNOSIS — O10919 Unspecified pre-existing hypertension complicating pregnancy, unspecified trimester: Secondary | ICD-10-CM

## 2020-11-21 DIAGNOSIS — Z3A14 14 weeks gestation of pregnancy: Secondary | ICD-10-CM

## 2020-11-21 LAB — POCT URINALYSIS DIP (DEVICE)
Bilirubin Urine: NEGATIVE
Glucose, UA: NEGATIVE mg/dL
Hgb urine dipstick: NEGATIVE
Ketones, ur: NEGATIVE mg/dL
Leukocytes,Ua: NEGATIVE
Nitrite: NEGATIVE
Protein, ur: NEGATIVE mg/dL
Specific Gravity, Urine: 1.02 (ref 1.005–1.030)
Urobilinogen, UA: 0.2 mg/dL (ref 0.0–1.0)
pH: 6 (ref 5.0–8.0)

## 2020-11-21 MED ORDER — ASPIRIN EC 81 MG PO TBEC: 81.0000 mg | DELAYED_RELEASE_TABLET | Freq: Every day | ORAL | 2 refills | Status: DC

## 2020-11-21 NOTE — Patient Instructions (Signed)
Compression stockings TED hoses  Tightness 20-10mmHg

## 2020-11-21 NOTE — Progress Notes (Signed)
   PRENATAL VISIT NOTE  Subjective:  Joyce Barnes is a 20 y.o. G1P0000 at [redacted]w[redacted]d being seen today for ongoing prenatal care.  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  Patient reports no complaints.  Contractions: Not present. Vag. Bleeding: None.  Movement: Absent. Denies leaking of fluid.   The following portions of the patient's history were reviewed and updated as appropriate: allergies, current medications, past family history, past medical history, past social history, past surgical history and problem list.   Objective:   Vitals:   11/21/20 1033  BP: 131/86  Pulse: 90  Weight: 260 lb (117.9 kg)    Fetal Status: Fetal Heart Rate (bpm): 152   Movement: Absent     General:  Alert, oriented and cooperative. Patient is in no acute distress.  Skin: Skin is warm and dry. No rash noted.   Cardiovascular: Normal heart rate noted  Respiratory: Normal respiratory effort, no problems with respiration noted  Abdomen: Soft, gravid, appropriate for gestational age.  Pain/Pressure: Absent     Pelvic: Cervical exam deferred        Extremities: Normal range of motion.  Edema: Trace  Mental Status: Normal mood and affect. Normal behavior. Normal judgment and thought content.   Assessment and Plan:  Pregnancy: G1P0000 at [redacted]w[redacted]d 1. Supervision of high risk pregnancy, antepartum Routine care. Patient amenable to starting low dose aspirin. Offer afp nv. Pt to be scheduled for anatomy u/s. Has slightly b/l LE edema. No e/o VTE, likely medication related; compression stockings recommended for her.  - [place] MFM OB DETAIL +14 WK; Future  2. [redacted] weeks gestation of pregnancy  3. Chronic hypertension affecting pregnancy Patient took  her meds today and BPs are good. Continue on procardia 60 bid and labetalol 200 bid  4. BMI 40.0-44.9, adult (HCC)  5. Obesity in pregnancy Stable. TWG goal d/w her  6. Biological false positive RPR test F/u at 28wks  7. Language barrier Interpreter used  Preterm labor symptoms and general obstetric precautions including but not limited to vaginal bleeding, contractions, leaking of fluid and fetal movement were reviewed in detail with the patient. Please refer to After Visit Summary for other counseling recommendations.   Return in about 2 weeks (around 12/05/2020) for in person, md visit.  Future Appointments  Date Time Provider Department Center  12/09/2020  8:55 AM Anyanwu, Jethro Bastos, MD [facility]  12/20/2020  9:30 AM [facility] NURSE [facility]  12/20/2020  9:45 AM [facility] US4 [facility]    [place], MD

## 2020-11-29 ENCOUNTER — Encounter (HOSPITAL_COMMUNITY): Payer: Self-pay | Admitting: Obstetrics & Gynecology

## 2020-11-29 ENCOUNTER — Inpatient Hospital Stay (HOSPITAL_COMMUNITY)
Admission: AD | Admit: 2020-11-29 | Discharge: 2020-11-29 | Disposition: A | Discharge status: Home / Self Care | Payer: Self-pay | Attending: Obstetrics & Gynecology | Admitting: Obstetrics & Gynecology

## 2020-11-29 ENCOUNTER — Other Ambulatory Visit: Payer: Self-pay

## 2020-11-29 DIAGNOSIS — Z3A15 15 weeks gestation of pregnancy: Secondary | ICD-10-CM | POA: Insufficient documentation

## 2020-11-29 DIAGNOSIS — R102 Pelvic and perineal pain unspecified side: Secondary | ICD-10-CM

## 2020-11-29 DIAGNOSIS — O26899 Other specified pregnancy related conditions, unspecified trimester: Secondary | ICD-10-CM

## 2020-11-29 DIAGNOSIS — R109 Unspecified abdominal pain: Secondary | ICD-10-CM | POA: Insufficient documentation

## 2020-11-29 DIAGNOSIS — Z3492 Encounter for supervision of normal pregnancy, unspecified, second trimester: Secondary | ICD-10-CM

## 2020-11-29 DIAGNOSIS — O26892 Other specified pregnancy related conditions, second trimester: Secondary | ICD-10-CM | POA: Insufficient documentation

## 2020-11-29 LAB — URINALYSIS, ROUTINE W REFLEX MICROSCOPIC
Bilirubin Urine: NEGATIVE
Glucose, UA: NEGATIVE mg/dL
Hgb urine dipstick: NEGATIVE
Ketones, ur: 80 mg/dL — AB
Leukocytes,Ua: NEGATIVE
Nitrite: NEGATIVE
Protein, ur: NEGATIVE mg/dL
Specific Gravity, Urine: 1.019 (ref 1.005–1.030)
pH: 5 (ref 5.0–8.0)

## 2020-11-29 LAB — WET PREP, GENITAL
Clue Cells Wet Prep HPF POC: NONE SEEN
Sperm: NONE SEEN
Trich, Wet Prep: NONE SEEN
Yeast Wet Prep HPF POC: NONE SEEN

## 2020-11-29 NOTE — Discharge Instructions (Signed)
TAKE EVENING BLOOD PRESSURE MEDS WHEN YOU GET HOME   Hipertensin durante el embarazo Hypertension During Pregnancy La hipertensin tambin se denomina presin arterial alta. La presin arterial alta significa que la fuerza de la sangre que se mueve por su cuerpo es lo suficientemente alta como para causarle problemas a usted y al beb. Durante el embarazo pueden producirse diferentes tipos de presin arterial alta. Los tipos son los siguientes:  Presin arterial alta antes de quedar embarazada. Esto se denomina hipertensin crnica..  Puede continuar durante el embarazo. El mdico querr continuar controlndole la presin arterial. Es posible que necesite medicamentos para controlar la presin arterial mientras est embarazada. Deber concurrir a visitas de seguimiento despus de haber tenido el beb.  Presin arterial alta que sube durante el embarazo cuando antes era normal. Esto se denomina hipertensin gestacional. Por lo general, mejorar despus de que tenga el beb, pero el mdico deber controlarle la presin arterial para asegurarse de que est mejorando.  Puede desarrollar presin arterial alta despus del parto. Esto se denomina hipertensin posparto. Suele ocurrir [employer] de las 48 horas despus del nacimiento del beb, pero puede aparecer [employer] despus de dar a luz. La presin arterial muy alta durante el embarazo es una emergencia que necesita tratamiento de inmediato. Jill Alexanders modo me afecta? Si tiene presin arterial alta durante el embarazo, tiene ms probabilidades de desarrollar presin arterial alta:  A medida que envejece.  Si queda embarazada nuevamente. En algunos casos, la presin arterial alta durante el embarazo puede causar lo siguiente:  Accidente cerebrovascular.  Infarto de miocardio.  [employer] riones, los pulmones o el hgado.  Preeclampsia.  Sndrome de HELLP.  Convulsiones.  Problemas con la placenta. Cmo afecta esto al beb? El beb  puede:  Nacer antes de tiempo.  No pesar tanto como debera.  No tolerar bien el trabajo de [place], lo que conduce a un parto por cesrea. Esta afeccin tambin puede provocar la muerte del beb antes del nacimiento (muerte fetal). Cules son los riesgos?  Haber tenido presin arterial alta durante un embarazo anterior.  Tener sobrepeso.  Tener 35aos o ms.  Estar embarazada por primera vez.  Estar embarazada de ms de un beb.  Embarazarse a travs de mtodos de fertilizacin como fertilizacin in vitro (FIV).  Tener otros problemas, como diabetes o enfermedad renal. Qu puedo hacer para reducir mi riesgo?  Mantenga un peso saludable.  Siga una dieta saludable.  Siga las indicaciones del mdico acerca de tratar cualquier problema mdico que tenga antes de quedar embarazada. Es muy importante concurrir a todas las visitas al [employer]. El mdico verificar su presin arterial y se asegurar de que el embarazo progrese como corresponde. El tratamiento debe comenzar con prontitud si se detecta un problema.   Cmo se trata? El tratamiento para la presin arterial alta durante el embarazo puede variar. Depende del tipo de presin arterial alta que tenga y de lo grave que sea.  Si estaba recibiendo un medicamento para la presin arterial antes de quedar embarazada, hable con el mdico. Es posible que tenga que cambiar el medicamento durante el embarazo si no es seguro para su beb.  Si su presin arterial sube durante el embarazo, el mdico puede indicarle un medicamento para tratarla.  Si est en riesgo de tener preeclampsia, el mdico puede pedirle que tome una dosis baja de aspirina mientras est embarazada.  Si tiene presin arterial muy alta, es posible que Hydrologist en el hospital para que usted y [place]  beb puedan ser vigilados de cerca. Es posible que deba tomar medicamentos para disminuir la presin arterial.  En ciertos casos, si su afeccin empeora, es posible que  necesite dar a Chief of Staff. Siga estas instrucciones en su casa: Comida y bebida  Beba suficiente lquido para mantener el pis (orina) de color amarillo plido.  Evite la cafena.   Estilo de vida  No fume ni consuma ningn producto que contenga nicotina o tabaco. Si necesita ayuda para dejar de fumar, consulte al mdico.  No consuma drogas ni alcohol.  Evite el estrs.  Haga reposo y [place].  La actividad fsica regular puede ayudar. Consulte al mdico qu tipos de ejercicios son mejores para usted. Instrucciones generales  Use los medicamentos de venta libre y los recetados solamente como se lo haya indicado el mdico.  Concurra a todas las visitas prenatales y de seguimiento. Comunquese con un mdico si:  Tiene sntomas a los que su mdico le indic que debera estar [place], tales como: ? Dolores de [place]. ? Ganas de vomitar (nuseas). ? Vmitos. ? Dolor en el vientre (abdominal). ? Sentirse mareada o aturdida. Solicite ayuda de inmediato si:  Tiene sntomas de problemas graves, como: ? Dolor muy intenso en el vientre que no mejora con el [place]. ? Dolor de cabeza muy intenso que no mejora. ? Visin borrosa. ? Visin doble. ? Vmitos que no mejoran. ? Aumenta de peso de forma rpida y repentina. ? Hinchazn repentina en las manos, los tobillos o el rostro. ? Hemorragia vaginal. ? Sangre en la orina. ? Falta de aire. ? Dolor de pecho. ? Debilidad en un lado del cuerpo. ? Dificultad para hablar.  El beb no se mueve tanto como sera usual. Estos sntomas pueden indicar Radio broadcast assistant. Solicite ayuda de inmediato. Comunquese con el servicio de emergencias de su localidad (911 en los [place]).  No espere a ver si los sntomas desaparecen.  No conduzca por sus propios medios [employer] hospital. Resumen  La presin arterial alta tambin se denomina hipertensin.  La presin arterial alta significa que la fuerza de la sangre que se mueve  por su cuerpo es lo suficientemente alta como para causarle problemas a usted y al beb.  Busque ayuda de inmediato si tiene sntomas de problemas graves debidos a la presin arterial alta.  Concurra a todas las visitas prenatales y de seguimiento. Esta informacin no tiene Theme park manager el consejo del mdico. Asegrese de hacerle al mdico cualquier pregunta que tenga. Document Revised: 06/14/2020 Document Reviewed: 05/14/2020 Elsevier Patient Education  2021 [employer].

## 2020-11-29 NOTE — MAU Provider Note (Signed)
Event Date/Time   First Provider Initiated Contact with Patient 11/29/20 1911     S Ms. Joyce Barnes is a 20 y.o. G1P0000 pregnant female at [redacted]w[redacted]d who presents to MAU today with complaint of lower pelvic/abdominal pain and discharge. Pain is mild but worsens at night, becoming more sharp and aggravated by movement. Began 3 days ago. Discharge began 2 days ago. Most recent IC was 3wks ago when FOB was in town. No vaginal bleeding.   Also reports hearing her heartbeat in her ear. Had congestion about a week ago that has since subsided leaving just the "palpitations" in her ear.   Being treated for hypertension with nifedipine and labetalol.  Spanish interpreter Joyce Barnes) present for interpretation services  Pertinent items noted in HPI and remainder of comprehensive ROS otherwise negative.  O Patient Vitals for the past 24 hrs:  BP Temp Temp src Pulse Resp SpO2 Height Weight  11/29/20 1918 (!) 149/86 -- -- 95 -- -- -- --  11/29/20 1840 (!) 148/86 -- -- (!) 110 -- -- -- --  11/29/20 1552 (!) 152/88 98.6 F (37 C) Oral 98 18 100 % 5\' 6"  (1.676 m) 258 lb 4.8 oz (117.2 kg)  BP elevated, pt has not yet taken evening dose of labetalol - instructed to take it when she gets home. Will send for BP check on Monday.  Physical Exam Vitals and nursing note reviewed.  Constitutional:      General: She is not in acute distress.    Appearance: She is not ill-appearing.  HENT:     Head: Normocephalic and atraumatic.  Eyes:     Pupils: Pupils are equal, round, and reactive to light.  Cardiovascular:     Rate and Rhythm: Normal rate.  Pulmonary:     Effort: Pulmonary effort is normal.  Abdominal:     General: Bowel sounds are normal.     Palpations: Abdomen is soft.     Tenderness: There is abdominal tenderness (mild above round ligaments and at pubic symphysis).     Hernia: No hernia is present.  Genitourinary:    Vagina: Normal.     Cervix: Normal.     Uterus: Normal.      Adnexa:  Right adnexa normal and left adnexa normal.  Skin:    General: Skin is warm and dry.     Capillary Refill: Capillary refill takes less than 2 seconds.  Neurological:     Mental Status: She is alert and oriented to person, place, and time.  Psychiatric:        Mood and Affect: Mood normal.        Behavior: Behavior normal.     FHR: 159  Results for orders placed or performed during the hospital encounter of 11/29/20 (from the past 24 hour(s))  Urinalysis, Routine w reflex microscopic Urine, Clean Catch     Status: Abnormal   Collection Time: 11/29/20  3:58 PM  Result Value Ref Range   Color, Urine YELLOW YELLOW   APPearance CLEAR CLEAR   Specific Gravity, Urine 1.019 1.005 - 1.030   pH 5.0 5.0 - 8.0   Glucose, UA NEGATIVE NEGATIVE mg/dL   Hgb urine dipstick NEGATIVE NEGATIVE   Bilirubin Urine NEGATIVE NEGATIVE   Ketones, ur 80 (A) NEGATIVE mg/dL   Protein, ur NEGATIVE NEGATIVE mg/dL   Nitrite NEGATIVE NEGATIVE   Leukocytes,Ua NEGATIVE NEGATIVE  Wet prep, genital     Status: Abnormal   Collection Time: 11/29/20  7:00 PM   Specimen:  Vaginal  Result Value Ref Range   Yeast Wet Prep HPF POC NONE SEEN NONE SEEN   Trich, Wet Prep NONE SEEN NONE SEEN   Clue Cells Wet Prep HPF POC NONE SEEN NONE SEEN   WBC, Wet Prep HPF POC MODERATE (A) NONE SEEN   Sperm NONE SEEN    Reviewed results with patient and provided reassurance. Demonstrated round ligament massage/stretching for relief of round ligament pain, pt verbalized understanding.  A Pain of round ligament affecting pregnancy, antepartum - Plan: Discharge patient  Fetal heart tones present, second trimester - Plan: Discharge patient  P Discharge from MAU in stable condition with second trimester precautions Follow up at [facility] for RN visit on Monday 12/02/20 for BP check   Bernerd Limbo, CNM 11/29/2020 8:32 PM

## 2020-11-29 NOTE — MAU Note (Signed)
It has been 3 days, she has been hearing her heart beat in her rt ear, when she lays down it get louder. Is having strong- sharp pains in lower abd, worse at night where she is not able to sleep.  When she went to the bathroom today, she noted that her underwear is wet, only noted one time. No bleeding. Is on medication for HTN,  124/75,130/76,135/80, checks every wk at home.

## 2020-12-02 LAB — GC/CHLAMYDIA PROBE AMP (~~LOC~~) NOT AT ARMC
Chlamydia: NEGATIVE
Comment: NEGATIVE
Comment: NORMAL
Neisseria Gonorrhea: NEGATIVE

## 2020-12-09 ENCOUNTER — Other Ambulatory Visit: Payer: Self-pay

## 2020-12-09 ENCOUNTER — Encounter: Payer: Self-pay | Admitting: Obstetrics & Gynecology

## 2020-12-09 ENCOUNTER — Ambulatory Visit (INDEPENDENT_AMBULATORY_CARE_PROVIDER_SITE_OTHER): Payer: Self-pay | Admitting: Obstetrics & Gynecology

## 2020-12-09 VITALS — BP 138/81 | HR 105 | Wt 261.2 lb

## 2020-12-09 DIAGNOSIS — O0990 Supervision of high risk pregnancy, unspecified, unspecified trimester: Secondary | ICD-10-CM

## 2020-12-09 DIAGNOSIS — O10919 Unspecified pre-existing hypertension complicating pregnancy, unspecified trimester: Secondary | ICD-10-CM

## 2020-12-09 DIAGNOSIS — Z3A17 17 weeks gestation of pregnancy: Secondary | ICD-10-CM

## 2020-12-09 LAB — POCT URINALYSIS DIP (DEVICE)
Bilirubin Urine: NEGATIVE
Glucose, UA: NEGATIVE mg/dL
Hgb urine dipstick: NEGATIVE
Ketones, ur: NEGATIVE mg/dL
Leukocytes,Ua: NEGATIVE
Nitrite: NEGATIVE
Protein, ur: NEGATIVE mg/dL
Specific Gravity, Urine: >=1.03 (ref 1.005–1.030)
Urobilinogen, UA: 0.2 mg/dL (ref 0.0–1.0)
pH: 5.5 (ref 5.0–8.0)

## 2020-12-09 NOTE — Progress Notes (Signed)
   PRENATAL VISIT NOTE  Subjective:  Joyce Barnes is a 20 y.o. G1P0000 at [redacted]w[redacted]d being seen today for ongoing prenatal care. Patient is Spanish-speaking only, interpreter present for this encounter. 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  Patient reports no complaints.  Contractions: Not present. Vag. Bleeding: None.  Movement: Absent. Denies leaking of fluid.   The following portions of the patient's history were reviewed and updated as appropriate: allergies, current medications, past family history, past medical history, past social history, past surgical history and problem list.   Objective:   Vitals:   12/09/20 0857  BP: 138/81  Pulse: (!) 105  Weight: 261 lb 3.2 oz (118.5 kg)    Fetal Status: Fetal Heart Rate (bpm): 142   Movement: Absent     General:  Alert, oriented and cooperative. Patient is in no acute distress.  Skin: Skin is warm and dry. No rash noted.   Cardiovascular: Normal heart rate noted  Respiratory: Normal respiratory effort, no problems with respiration noted  Abdomen: Soft, gravid, appropriate for gestational age.  Pain/Pressure: Absent     Pelvic: Cervical exam deferred        Extremities: Normal range of motion.  Edema: Trace  Mental Status: Normal mood and affect. Normal behavior. Normal judgment and thought content.   Assessment and Plan:  Pregnancy: G1P0000 at [redacted]w[redacted]d 1. Chronic hypertension affecting pregnancy Stable BP, continue Nifedipine and Labetalol  2. [redacted] weeks gestation of pregnancy 3. Supervision of high risk pregnancy, antepartum AFP today. Already scheduled for anatomy scan. - AFP, Serum, Open Spina Bifida No other complaints or concerns.  Routine obstetric  precautions reviewed.  Please refer to After Visit Summary for other counseling recommendations.   Return in about 4 weeks (around 01/06/2021) for OFFICE OB VISIT (MD only).  Future Appointments  Date Time Provider Department Center  12/20/2020  9:30 AM [facility] NURSE [facility]  12/20/2020  9:45 AM [id number]    Jaynie Collins, MD

## 2020-12-09 NOTE — Patient Instructions (Signed)
Segundo trimestre de embarazo Second Trimester of Pregnancy  El segundo trimestre de embarazo va desde la semana 13 hasta la semana 27. Es decir desde el mes 4 hasta el mes 6 de embarazo. El segundo trimestre suele ser el momento en el que mejor se siente. Su organismo se ha adaptado a estar embarazada, y comienza a sentirse fsicamente mejor. Durante el segundo trimestre:  Las nuseas del embarazo han disminuido o han desaparecido completamente.  Usted puede tener ms energa.  Es posible que tenga un aumento del apetito. El segundo trimestre es tambin un perodo en el que el beb en gestacin (feto) crece rpidamente. Hacia el final del sexto mes, el feto puede medir aproximadamente 12 pulgadas y pesar alrededor de 1 libras. Es probable que sienta que el beb se mueve (da pataditas) entre las 16 y 20semanas del embarazo. Cambios en el cuerpo durante el segundo trimestre Su cuerpo continua experimentando numerosos cambios durante su segundo trimestre. Los cambios varan y generalmente vuelven a la normalidad despus del nacimiento del beb. Cambios fsicos  Seguir aumentando de peso. Notar que la parte baja del abdomen sobresale.  Podrn aparecer las primeras estras en las caderas, el abdomen y las mamas.  Las mamas seguirn creciendo y se tornarn sensibles.  Pueden aparecer zonas oscuras o manchas (cloasma o mscara del embarazo) en el rostro.  Es posible que se forme una lnea oscura desde el ombligo hasta la zona del pubis (linea nigra).  Tal vez haya cambios en el cabello. Esto cambios pueden incluir su engrosamiento, crecimiento rpido y cambios en la textura. A algunas personas tambin se les cae el cabello durante o despus del embarazo, o tienen el cabello seco o fino. Cambios en la salud  Comienza a tener dolores de cabeza.  Es posible que tenga acidez estomacal.  Puede tener estreimiento.  Pueden aparecer hemorroides o abultarse e hincharse las venas (venas  varicosas).  Las encas pueden sangrar y estar sensibles al cepillado y al hilo dental.  Tal vez tenga necesidad de orinar con ms frecuencia porque el feto est ejerciendo presin sobre la vejiga.  Puede sentir dolor en la espalda. Esto se debe a: ? Aumento de peso. ? Las hormonas del embarazo relajan las articulaciones en la pelvis. ? Un cambio en el peso y los msculos que ayudan a mantener su equilibrio. Siga estas instrucciones en su casa: Medicamentos  Siga las instrucciones del mdico en relacin con el uso de medicamentos. Durante el embarazo, hay medicamentos que pueden tomarse y otros que no. No tome medicamentos a menos que lo haya autorizado el mdico.  Tome vitaminas prenatales que contengan por lo menos 600microgramos (mcg) de cido flico. Comida y bebida  Lleve una dieta saludable que incluya frutas y verduras frescas, cereales integrales, buenas fuentes de protenas como carnes magras, huevos o tofu, y productos lcteos descremados.  Evite la carne cruda y el jugo, la leche y el queso sin pasteurizar. Estos portan grmenes que pueden provocar dao tanto a usted como al beb.  Es posible que tenga que tomar estas medidas para prevenir o tratar el estreimiento: ? Beber suficiente lquido como para mantener la orina de color amarillo plido. ? Consumir alimentos ricos en fibra, como frijoles, cereales integrales, y frutas y verduras frescas. ? Limitar el consumo de alimentos ricos en grasa y azcares procesados, como los alimentos fritos o dulces. Actividad  Haga ejercicio solamente como se lo haya indicado el mdico. La mayora de las personas pueden continuar su rutina de ejercicios   durante el embarazo. Intente realizar como mnimo 30minutos de actividad fsica por lo menos 5das a la semana. Deje de hacer ejercicio si comienza a tener contracciones en el tero.  Deje de hacer ejercicio si le aparecen dolor o clicos en la parte baja del vientre o de la  espalda.  Evite hacer ejercicio si hace mucho calor o humedad, o si se encuentra a una altitud elevada.  Evite levantar pesos excesivos.  Si lo desea, puede seguir teniendo relaciones sexuales, salvo que el mdico le indique lo contrario. Alivio del dolor y del malestar  Use un sujetador que le brinde buen soporte para prevenir las molestias causadas por la sensibilidad en las mamas.  Dese baos de asiento con agua tibia para aliviar el dolor o las molestias causadas por las hemorroides. Use una crema para las hemorroides si el mdico la autoriza.  Descanse con las piernas levantadas (elevadas) si tiene calambres en las piernas o dolor en la parte baja de la espalda.  Si tiene venas varicosas: ? Use medias de compresin como se lo haya indicado el mdico. ? Eleve los pies durante 15minutos, 3 o 4veces por da. ? Limite el consumo de sal en su dieta. Seguridad  Use el cinturn de seguridad en todo momento mientras conduce o va en auto.  Hable con el mdico si es vctima de maltrato verbal o fsico. Estilo de vida  No se d baos de inmersin en agua caliente, baos turcos ni saunas.  No se haga duchas vaginales. No use tampones ni toallas higinicas perfumadas.  Evite el contacto con las bandejas sanitarias de los gatos y la tierra que estos animales usan. Estos elementos contienen bacterias que pueden causar defectos congnitos al beb y la posible prdida del feto debido a un aborto espontneo o muerte fetal.  No use remedios a base de hierbas, alcohol, drogas ilegales ni medicamentos que no estn aprobados por el mdico. Las sustancias qumicas de estos productos pueden daar al beb.  No consuma ningn producto que contenga nicotina o tabaco, como cigarrillos, cigarrillos electrnicos y tabaco de mascar. Si necesita ayuda para dejar de fumar, consulte al mdico. Instrucciones generales  Durante una visita prenatal de rutina, el mdico le har un examen fsico y otras  pruebas. Tambin le hablar sobre su salud general. Cumpla con todas las visitas de seguimiento. Esto es importante.  Pdale al mdico que la derive a clases de educacin prenatal en su localidad.  Pida ayuda si tiene necesidades nutricionales o de asesoramiento durante el embarazo. El mdico puede aconsejarla o derivarla a especialistas para que la ayuden con diferentes necesidades. Dnde buscar ms informacin  American Pregnancy Association (Asociacin Americana del Embarazo): americanpregnancy.org  American College of Obstetricians and Gynecologists (Colegio Estadounidense de Obstetras y Gineclogos): acog.org/womens-health/pregnancy?  Office on Women's Health (Oficina para la Salud de la Mujer): womenshealth.gov/pregnancy Comunquese con un mdico si tiene:  Un dolor de cabeza que no desaparece despus de tomar analgsicos.  Cambios en la visin o ve manchas delante de los ojos.  Clicos leves, presin en la pelvis o dolor persistente en el abdomen.  Nuseas persistentes, vmitos o diarrea.  Secrecin vaginal con mal olor u orina con olor ftido.  Dolor al orinar.  Hinchazn sbita o extrema del rostro, las manos, los tobillos, los pies o las piernas.  Fiebre. Busque ayuda de inmediato si:  Tiene una prdida de lquido por la vagina.  Tiene sangrado ligero o manchas vaginales.  Tiene dolor intenso o clicos en el abdomen.    Presenta dificultad para respirar.  Siente dolor en el pecho.  Tiene episodios de desmayo.  No ha sentido a su beb moverse durante el perodo de tiempo que le indic el mdico.  Tiene dolor, hinchazn o enrojecimiento nuevos o ms intensos en un brazo o una pierna. Resumen  El segundo trimestre de embarazo va desde la semana 13 hasta la 27 (desde el mes 4 hasta el 6).  No use remedios a base de hierbas, alcohol, drogas ilegales ni medicamentos que no estn aprobados por el mdico. Las sustancias qumicas de estos productos pueden daar al  beb.  Haga ejercicio solamente como se lo haya indicado el mdico. La mayora de las personas pueden continuar su rutina de ejercicios durante el embarazo.  Cumpla con todas las visitas de seguimiento. Esto es importante. Esta informacin no tiene como fin reemplazar el consejo del mdico. Asegrese de hacerle al mdico cualquier pregunta que tenga. Document Revised: 02/12/2020 Document Reviewed: 02/12/2020 Elsevier Patient Education  2021 Elsevier Inc.  

## 2020-12-10 LAB — AFP, SERUM, OPEN SPINA BIFIDA
AFP MoM: 0.98
AFP Value: 28.6 ng/mL
Gest. Age on Collection Date: 17.1 weeks
Maternal Age At EDD: 21.9 yr
OSBR Risk 1 IN: 10000
Test Results:: NEGATIVE
Weight: 261 [lb_av]

## 2020-12-11 ENCOUNTER — Telehealth: Payer: Self-pay | Admitting: Lactation Services

## 2020-12-11 NOTE — Telephone Encounter (Signed)
Called patient with assistance of [employer], Research officer, trade union.   Patient was informed that her AFP is normal or negative.   She was asked about Genetic Screening and if she would like it repeated. She reports she is aware that there was insufficient Fetal DNA. Patient would like to have it redrawn at her next appointment.   Patient with no other questions or concerns.

## 2020-12-11 NOTE — Telephone Encounter (Signed)
-----   Message from Tereso Newcomer, MD sent at 12/11/2020  9:31 AM EDT ----- Upon review of her AFP (negative), I noticed that her Panaroma results (scanned under Media) were resulted as insufficient.  Was she offered a redraw? If not, can we offer it?  Thank you!  Spanish speaking only.   Jaynie Collins, MD

## 2020-12-20 ENCOUNTER — Ambulatory Visit (HOSPITAL_BASED_OUTPATIENT_CLINIC_OR_DEPARTMENT_OTHER): Payer: Self-pay | Admitting: *Deleted

## 2020-12-20 ENCOUNTER — Encounter: Payer: Self-pay | Admitting: *Deleted

## 2020-12-20 ENCOUNTER — Other Ambulatory Visit: Payer: Self-pay | Admitting: *Deleted

## 2020-12-20 ENCOUNTER — Ambulatory Visit: Payer: Self-pay | Attending: Obstetrics and Gynecology

## 2020-12-20 ENCOUNTER — Other Ambulatory Visit: Payer: Self-pay

## 2020-12-20 VITALS — BP 159/73 | HR 116

## 2020-12-20 DIAGNOSIS — O10912 Unspecified pre-existing hypertension complicating pregnancy, second trimester: Secondary | ICD-10-CM

## 2020-12-20 DIAGNOSIS — O0990 Supervision of high risk pregnancy, unspecified, unspecified trimester: Secondary | ICD-10-CM | POA: Insufficient documentation

## 2020-12-20 DIAGNOSIS — Z3A18 18 weeks gestation of pregnancy: Secondary | ICD-10-CM

## 2020-12-20 DIAGNOSIS — O99212 Obesity complicating pregnancy, second trimester: Secondary | ICD-10-CM

## 2020-12-20 NOTE — Progress Notes (Signed)
MFM Note  Joyce Barnes was seen for a detailed fetal anatomy scan due to maternal obesity with a BMI of 41 and chronic hypertension currently treated with labetalol and Procardia.  The patient's blood pressures today were 146/80 and 159/73.   She denies any other significant past medical history and denies any problems in her current pregnancy.    Her cell free DNA test showed insufficient fetal DNA. She will have a redraw of the cell free DNA test at her next prenatal visit.  She was informed that the fetal growth and amniotic fluid level were appropriate for her gestational age.   There were no obvious fetal anomalies noted on today's ultrasound exam.  However, the views of the fetal anatomy were limited today due to the fetal position and maternal body habitus.  The patient was informed that anomalies may be missed due to technical limitations. If the fetus is in a suboptimal position or maternal habitus is increased, visualization of the fetus in the maternal uterus may be impaired.  The implications and management of chronic hypertension in pregnancy was discussed. The patient was advised that should her blood pressures continue to be elevated, the dosage of her antihypertensive medications may need to be increased.  The increased risk of superimposed preeclampsia, an indicated preterm delivery, and possible fetal growth restriction due to chronic hypertension in pregnancy was discussed. The patient was advised that we will continue to follow her closely throughout her pregnancy. We will continue to follow her with monthly growth scans. Weekly fetal testing should be started at around 32 weeks.   To decrease her risk of superimposed preeclampsia, she should continue taking a daily baby aspirin (81 mg daily) for preeclampsia prophylaxis.   A follow-up exam was scheduled in 4 weeks to complete the views of the fetal anatomy.    All conversations were held with the patient today with  the help of a Spanish interpreter.  A total of 30 minutes was spent counseling and coordinating the care for this patient.  Greater than 50% of the time was spent in direct face-to-face contact.

## 2020-12-31 ENCOUNTER — Encounter (HOSPITAL_COMMUNITY): Payer: Self-pay | Admitting: Obstetrics and Gynecology

## 2020-12-31 ENCOUNTER — Other Ambulatory Visit: Payer: Self-pay

## 2020-12-31 ENCOUNTER — Inpatient Hospital Stay (HOSPITAL_COMMUNITY)
Admission: AD | Admit: 2020-12-31 | Discharge: 2020-12-31 | Disposition: A | Discharge status: Home / Self Care | Payer: Self-pay | Attending: Obstetrics and Gynecology | Admitting: Obstetrics and Gynecology

## 2020-12-31 DIAGNOSIS — Z79899 Other long term (current) drug therapy: Secondary | ICD-10-CM | POA: Insufficient documentation

## 2020-12-31 DIAGNOSIS — O10919 Unspecified pre-existing hypertension complicating pregnancy, unspecified trimester: Secondary | ICD-10-CM

## 2020-12-31 DIAGNOSIS — O26892 Other specified pregnancy related conditions, second trimester: Secondary | ICD-10-CM | POA: Insufficient documentation

## 2020-12-31 DIAGNOSIS — Z3A20 20 weeks gestation of pregnancy: Secondary | ICD-10-CM | POA: Insufficient documentation

## 2020-12-31 DIAGNOSIS — O10012 Pre-existing essential hypertension complicating pregnancy, second trimester: Secondary | ICD-10-CM

## 2020-12-31 DIAGNOSIS — O99212 Obesity complicating pregnancy, second trimester: Secondary | ICD-10-CM

## 2020-12-31 DIAGNOSIS — N898 Other specified noninflammatory disorders of vagina: Secondary | ICD-10-CM | POA: Insufficient documentation

## 2020-12-31 DIAGNOSIS — O10912 Unspecified pre-existing hypertension complicating pregnancy, second trimester: Secondary | ICD-10-CM | POA: Insufficient documentation

## 2020-12-31 DIAGNOSIS — Z7982 Long term (current) use of aspirin: Secondary | ICD-10-CM | POA: Insufficient documentation

## 2020-12-31 LAB — WET PREP, GENITAL
Clue Cells Wet Prep HPF POC: NONE SEEN
Sperm: NONE SEEN
Trich, Wet Prep: NONE SEEN
Yeast Wet Prep HPF POC: NONE SEEN

## 2020-12-31 NOTE — MAU Provider Note (Signed)
History     CSN: [id number]  Arrival date and time: 12/31/20 2033   None     Chief Complaint  Patient presents with  . Vaginal Discharge   HPI  Patient is a 20 y/o g1p0 at [redacted]w[redacted]d who presents for pink discharge at 5pm. Happened once and did not recur. Discharge was not associated with abdominal pain. No cramping. Also noted that her BP was high at home. Patient has a history of chronic hypertension on labetalol and procardia. She last took her medication this morning. She also has a BMI of 41. No recent interoucrse, no vaginal pain or itching. No foul smelling discharge. No urinary discomfort. No recent new sexual partners.   OB History    Gravida  1   Para  0   Term  0   Preterm  0   AB  0   Living  0     SAB  0   IAB  0   Ectopic  0   Multiple  0   Live Births  0           Past Medical History:  Diagnosis Date  . Hypertension     Past Surgical History:  Procedure Laterality Date  . APPENDECTOMY      Family History  Problem Relation Age of Onset  . Healthy Mother   . Diabetes Father   . Cancer Maternal Aunt     Social History   Tobacco Use  . Smoking status: Never Smoker  . Smokeless tobacco: Never Used  Vaping Use  . Vaping Use: Never used  Substance Use Topics  . Alcohol use: Not Currently  . Drug use: Never    Allergies: No Known Allergies  Medications Prior to Admission  Medication Sig Dispense Refill Last Dose  . aspirin EC 81 MG tablet Take 1 tablet (81 mg total) by mouth daily. 60 tablet 2 12/31/2020 at Unknown time  . labetalol (NORMODYNE) 200 MG tablet Take 1 tablet (200 mg total) by mouth 2 (two) times daily. 60 tablet 3 12/31/2020 at Unknown time  . NIFEdipine (PROCARDIA XL/NIFEDICAL XL) 60 MG 24 hr tablet Take 1 tablet (60 mg total) by mouth daily. 30 tablet 5 12/31/2020 at Unknown time  . Prenatal Vit-Fe Fumarate-FA (PRENATAL VITAMINS PO) Take 1 tablet by mouth daily.   12/31/2020 at Unknown time  . doxylamine, Sleep,  (UNISOM) 25 MG tablet Take 1 tablet (25 mg total) by mouth 4 (four) times daily as needed (nausea and vomiting). (Patient not taking: Reported on 12/09/2020) 30 tablet 2     Review of Systems  Constitutional: Negative for chills, diaphoresis, fatigue and fever.  Gastrointestinal: Negative for abdominal distention, abdominal pain, constipation, diarrhea, rectal pain and vomiting.  Genitourinary: Negative for difficulty urinating, dysuria and flank pain.   Physical Exam   Blood pressure 125/69, pulse 85, temperature 98 F (36.7 C), resp. rate 18, last menstrual period 07/06/2020, SpO2 100 %.  Physical Exam Vitals and nursing note reviewed.  Constitutional:      Appearance: Normal appearance.  HENT:     Head: Normocephalic and atraumatic.  Cardiovascular:     Rate and Rhythm: Normal rate.  Pulmonary:     Effort: Pulmonary effort is normal.  Abdominal:     Palpations: Abdomen is soft.     Tenderness: There is no abdominal tenderness.  Skin:    General: Skin is warm and dry.  Neurological:     Mental Status: She is alert.  Psychiatric:  Mood and Affect: Mood normal.        Behavior: Behavior normal.    FHR 150   MAU Course  Procedures  MDM -Admission BP elevated 150's systolic. On repeat check with different cuff, BP is 125/69.  -Pt evaluated at bedside. Normal physical exam. No active discharge. No abdominal pain.  -FHR normal.  -Wet prep/GCC obtained -stable for D/C home  Assessment and Plan   Vaginal Discharge -reassuring exam and normal FHR. Will obtain wet prep/gcc and follow up w patient -provided return precautions  Chronic HTN: -repeat blood pressure is normal. She should bring BP cuff to next appointment to make sure it is calibrated appropriately. Counseled to continue current medication regimen.  -has OB appointment Monday   Arlana Pouch [facility] 12/31/2020, 9:04 PM

## 2020-12-31 NOTE — MAU Note (Addendum)
Pt reports that she is [redacted] weeks pregnant and is high risk. Pt reports at 1700 she had a lot of pink discharge. She reports no more since the one time.   Pt reports high blood pressure at home. She reports it was 157/90.

## 2021-01-01 LAB — GC/CHLAMYDIA PROBE AMP (~~LOC~~) NOT AT ARMC
Chlamydia: NEGATIVE
Comment: NEGATIVE
Comment: NORMAL
Neisseria Gonorrhea: NEGATIVE

## 2021-01-06 ENCOUNTER — Ambulatory Visit (INDEPENDENT_AMBULATORY_CARE_PROVIDER_SITE_OTHER): Payer: Self-pay | Admitting: Obstetrics and Gynecology

## 2021-01-06 ENCOUNTER — Other Ambulatory Visit: Payer: Self-pay

## 2021-01-06 VITALS — BP 140/79 | HR 88 | Wt 263.0 lb

## 2021-01-06 DIAGNOSIS — Z6841 Body Mass Index (BMI) 40.0 and over, adult: Secondary | ICD-10-CM

## 2021-01-06 DIAGNOSIS — O99210 Obesity complicating pregnancy, unspecified trimester: Secondary | ICD-10-CM

## 2021-01-06 DIAGNOSIS — O0990 Supervision of high risk pregnancy, unspecified, unspecified trimester: Secondary | ICD-10-CM

## 2021-01-06 DIAGNOSIS — Z789 Other specified health status: Secondary | ICD-10-CM

## 2021-01-06 DIAGNOSIS — R768 Other specified abnormal immunological findings in serum: Secondary | ICD-10-CM

## 2021-01-06 DIAGNOSIS — Z3A21 21 weeks gestation of pregnancy: Secondary | ICD-10-CM

## 2021-01-06 DIAGNOSIS — O10919 Unspecified pre-existing hypertension complicating pregnancy, unspecified trimester: Secondary | ICD-10-CM

## 2021-01-06 LAB — POCT URINALYSIS DIP (DEVICE)
Bilirubin Urine: NEGATIVE
Glucose, UA: NEGATIVE mg/dL
Hgb urine dipstick: NEGATIVE
Ketones, ur: NEGATIVE mg/dL
Leukocytes,Ua: NEGATIVE
Nitrite: NEGATIVE
Protein, ur: NEGATIVE mg/dL
Specific Gravity, Urine: >=1.03 (ref 1.005–1.030)
Urobilinogen, UA: 0.2 mg/dL (ref 0.0–1.0)
pH: 6 (ref 5.0–8.0)

## 2021-01-06 MED ORDER — LABETALOL HCL 300 MG PO TABS: 300.0000 mg | ORAL_TABLET | Freq: Two times a day (BID) | ORAL | 3 refills | Status: DC

## 2021-01-06 NOTE — Progress Notes (Signed)
   PRENATAL VISIT NOTE  Subjective:  Joyce Barnes is a 20 y.o. G1P0000 at [redacted]w[redacted]d being seen today for ongoing prenatal care.  She is currently monitored for the following issues for this high-risk pregnancy and has Supervision of high risk pregnancy, antepartum; Chronic hypertension affecting pregnancy; Language barrier; BMI 40.0-44.9, adult (HCC); Obesity in pregnancy; Biological false positive RPR test; and [redacted] weeks gestation of pregnancy on their problem list.  Patient doing well with no acute concerns today. She reports no complaints.  Contractions: Not present. Vag. Bleeding: None.  Movement: Present. Denies leaking of fluid.   The following portions of the patient's history were reviewed and updated as appropriate: allergies, current medications, past family history, past medical history, past social history, past surgical history and problem list. Problem list updated.  Objective:   Vitals:   01/06/21 0823  BP: 140/79  Pulse: 88  Weight: 263 lb (119.3 kg)    Fetal Status: Fetal Heart Rate (bpm): 150 Fundal Height: 21 cm Movement: Present     General:  Alert, oriented and cooperative. Patient is in no acute distress.  Skin: Skin is warm and dry. No rash noted.   Cardiovascular: Normal heart rate noted  Respiratory: Normal respiratory effort, no problems with respiration noted  Abdomen: Soft, gravid, appropriate for gestational age.  Pain/Pressure: Absent     Pelvic: Cervical exam deferred        Extremities: Normal range of motion.  Edema: Trace  Mental Status:  Normal mood and affect. Normal behavior. Normal judgment and thought content.   Assessment and Plan:  Pregnancy: G1P0000 at [redacted]w[redacted]d  1. [redacted] weeks gestation of pregnancy   2. Chronic hypertension affecting pregnancy Labetalol increased to 330 mg BID, recheck BP in 2 weeks - labetalol (NORMODYNE) 300 MG tablet; Take 1 tablet (300 mg total) by mouth 2 (two) times daily.  Dispense: 60 tablet; Refill: 3  3.  Supervision of high risk pregnancy, antepartum  - Genetic Screening  4. Obesity in pregnancy   5. Language barrier Interpreter present  6. BMI 40.0-44.9, adult (HCC)   7. Biological false positive RPR test   Preterm labor symptoms and general obstetric precautions including but not limited to vaginal bleeding, contractions, leaking of fluid and fetal movement were reviewed in detail with the patient.  Please refer to After Visit Summary for other counseling recommendations.   Return in about 2 weeks (around 01/20/2021) for [facility], in person.   Mariel Aloe, MD Faculty Attending [facility]

## 2021-01-09 ENCOUNTER — Encounter: Payer: Self-pay | Admitting: *Deleted

## 2021-01-16 ENCOUNTER — Encounter (HOSPITAL_COMMUNITY): Payer: Self-pay | Admitting: Obstetrics and Gynecology

## 2021-01-16 ENCOUNTER — Other Ambulatory Visit: Payer: Self-pay

## 2021-01-16 ENCOUNTER — Inpatient Hospital Stay (HOSPITAL_COMMUNITY)
Admission: AD | Admit: 2021-01-16 | Discharge: 2021-01-16 | Disposition: A | Discharge status: Home / Self Care | Payer: Self-pay | Attending: Obstetrics and Gynecology | Admitting: Obstetrics and Gynecology

## 2021-01-16 DIAGNOSIS — O162 Unspecified maternal hypertension, second trimester: Secondary | ICD-10-CM | POA: Insufficient documentation

## 2021-01-16 DIAGNOSIS — R300 Dysuria: Secondary | ICD-10-CM

## 2021-01-16 DIAGNOSIS — R109 Unspecified abdominal pain: Secondary | ICD-10-CM | POA: Insufficient documentation

## 2021-01-16 DIAGNOSIS — Z3A22 22 weeks gestation of pregnancy: Secondary | ICD-10-CM | POA: Insufficient documentation

## 2021-01-16 DIAGNOSIS — O26892 Other specified pregnancy related conditions, second trimester: Secondary | ICD-10-CM | POA: Insufficient documentation

## 2021-01-16 DIAGNOSIS — N949 Unspecified condition associated with female genital organs and menstrual cycle: Secondary | ICD-10-CM

## 2021-01-16 DIAGNOSIS — Z79899 Other long term (current) drug therapy: Secondary | ICD-10-CM | POA: Insufficient documentation

## 2021-01-16 DIAGNOSIS — Z7982 Long term (current) use of aspirin: Secondary | ICD-10-CM | POA: Insufficient documentation

## 2021-01-16 LAB — URINALYSIS, ROUTINE W REFLEX MICROSCOPIC
Bilirubin Urine: NEGATIVE
Glucose, UA: NEGATIVE mg/dL
Hgb urine dipstick: NEGATIVE
Ketones, ur: NEGATIVE mg/dL
Nitrite: NEGATIVE
Protein, ur: NEGATIVE mg/dL
Specific Gravity, Urine: 1.014 (ref 1.005–1.030)
pH: 6 (ref 5.0–8.0)

## 2021-01-16 MED ORDER — ACETAMINOPHEN 325 MG PO TABS: 650.0000 mg | ORAL_TABLET | ORAL | 2 refills | Status: DC | PRN

## 2021-01-16 MED ORDER — CYCLOBENZAPRINE HCL 5 MG PO TABS: 5.0000 mg | ORAL_TABLET | Freq: Three times a day (TID) | ORAL | 0 refills | Status: DC | PRN

## 2021-01-16 NOTE — Discharge Instructions (Signed)
Signos y sntomas del trabajo de parto Signs and Symptoms of Labor [place] es el proceso natural del cuerpo para sacar al beb y a la placenta del tero. Por lo general, el proceso del trabajo de parto comienza cuando el embarazo ha llegado a su trmino, entre las semanas 33 y 34 del Media planner. Signos y sntomas de que est cerca de empezar el [place] de parto A medida que el cuerpo se prepara para el [place] de parto y el nacimiento del beb, puede notar los siguientes sntomas en las semanas y [place] anteriores al trabajo de parto propiamente dicho:  Eliminacin de una pequea cantidad de mucosidad espesa y sanguinolenta por la vagina. A esto se lo llama aparicin normal de sangre o prdida del tapn mucoso. Esto puede suceder ms de una semana antes de que comience el [place] de [place], o justo antes de que comience el [place] de parto a medida que el cuello uterino comienza a ensancharse (dilatarse). En algunas mujeres, el tapn [employer] entero de una sola vez. En otras, pueden salir partes del tapn mucoso de forma gradual [employer].  El beb se mueve (desciende) a la parte inferior de la pelvis para ponerse en posicin para el nacimiento (aligeramiento). Cuando esto sucede, puede sentir ms presin en la vejiga y el hueso plvico, y menos presin en las costillas. Esto facilitar la respiracin. Tambin puede hacer que necesite orinar con ms frecuencia y que tenga problemas para Public house manager.  Tener "contracciones de prctica", tambin llamadas contracciones de [place], o [place] de [place]. Estas se producen a intervalos irregulares (espaciadas de modo desigual) con una diferencia de ms de 10 minutos. Las contracciones de [place] de parto falso son frecuentes despus del ejercicio o la actividad sexual. Se detendrn si cambia de posicin, descansa o bebe lquidos. Estas contracciones son generalmente leves y no se tornan ms fuertes con el tiempo. Pueden  sentirse como lo siguiente: ? Un dolor de espalda. ? Calambres leves, similares a los [employer]. ? Tirantez o presin en el abdomen. Otros sntomas tempranos pueden ser los siguientes:  Nuseas o prdida del apetito.  Diarrea.  Una repentina explosin de energa o sentirse muy cansada.  Cambios en el humor.  Problemas para dormir.   Signos y sntomas de que ha comenzado el trabajo de parto [place] signos de que est en trabajo de parto pueden incluir los siguientes:  Contracciones a intervalos regulares (espaciadas de modo regular) que se incrementan en intensidad. Esto puede sentirse como presin o estrechamiento intenso en el abdomen, que se desplaza hacia la espalda. ? Las contracciones pueden sentirse tambin como dolor rtmico en la parte superior de los muslos y la espalda que va y viene a intervalos regulares. ? Para las [employer] primerizas, este cambio en la intensidad de las contracciones ocurre generalmente a un ritmo ms gradual. ? Las mujeres que ya han dado a luz pueden notar una progresin ms rpida de los cambios de las contracciones.  Sensacin de presin en el rea vaginal.  Ruptura de la bolsa (ruptura de las [place]). Es Cabin crew en que el saco de lquido que rodea al beb se rompe. La prdida de lquido de la vagina puede ser transparente o estar teida de sangre. Generalmente el trabajo de parto comienza 24 horas despus de la ruptura de [place], [place] puede tomar ms Oceanographer. ? Algunas mujeres pueden sentir una prdida repentina de lquido. ? Otras notan que la ropa interior se moja  repetidas veces. Siga estas instrucciones en su casa:  Cuando comience el trabajo de parto o si rompe bolsa, llame al mdico o a la lnea de atencin de enfermera. Ellos determinarn, en funcin de su situacin, cundo debe ir a hacerse un examen.  Durante el trabajo de parto temprano, es posible que pueda descansar y manejar los sntomas en su casa. Algunas  estrategias para probar en su casa incluyen: ? Tcnicas de respiracin y relajacin. ? Tomar una ducha o un bao de inmersin tibios. ? Escuchar msica. ? Usar una almohadilla trmica en la espalda para aliviar el dolor. Si se le indica que use calor:  Coloque una toalla entre la piel y la fuente de calor.  Aplique calor durante 20 a 30minutos.  Retire la fuente de calor si la piel se pone de color rojo brillante. Esto es especialmente importante si no puede sentir dolor, calor o fro. Puede correr un mayor riesgo de sufrir quemaduras.   Comunquese con un mdico si:  Comenz el trabajo de parto.  Rompe la bolsa. Solicite ayuda de inmediato si:  Tiene contracciones dolorosas y regulares cada 5 minutos o menos.  El trabajo de parto comienza antes de que se cumplan las 37 semanas de embarazo.  Tiene fiebre.  Elimina cogulos de sangre de color rojo brillante por la vagina.  No siente que el beb se mueva.  Tiene dolor de cabeza intenso con o sin problemas de visin.  Tiene nuseas intensas, vmitos o diarrea.  Siente falta de aire o dolor en el pecho. Estos sntomas pueden representar un problema grave que constituye una emergencia. No espere a ver si los sntomas desaparecen. Solicite atencin mdica de inmediato. Comunquese con el servicio de emergencias de su localidad (911 en los [place]). No conduzca por sus propios medios hasta el hospital. Resumen  El trabajo de parto es el proceso natural del cuerpo por el cual se saca al beb y la placenta del tero.  Por lo general, el proceso del trabajo de parto comienza cuando el embarazo ha llegado a su trmino, entre las semanas 37 y 40 de embarazo.  Cuando comience el trabajo de parto o si rompe bolsa, llame al mdico o a la lnea de atencin de enfermera. Ellos determinarn, en funcin de su situacin, cundo debe ir a hacerse un examen. Esta informacin no tiene como fin reemplazar el consejo del mdico. Asegrese de  hacerle al mdico cualquier pregunta que tenga. Document Revised: 06/14/2020 Document Reviewed: 06/14/2020 Elsevier Patient Education  2021 Elsevier Inc.  

## 2021-01-16 NOTE — MAU Provider Note (Addendum)
Chief Complaint:  Abdominal Pain, Back Pain, and Dysuria   Event Date/Time   First Provider Initiated Contact with Patient 01/16/21 0738     HPI  HPI: Joyce Barnes is a 20 y.o. G1P0000 at 31w5dwho presents to maternity admissions reporting sharp pains over bladder with some low to mid back pain also    This has been going on for 4 days.  Tried resting but it did not get better.  Has appt at office this week. . She reports good fetal movement, denies LOF, vaginal bleeding, vaginal itching/burning, urinary symptoms, h/a, dizziness, n/v, diarrhea, constipation or fever/chills.  .   Past Medical History: Past Medical History:  Diagnosis Date  . Hypertension     Past obstetric history: OB History  Gravida Para Term Preterm AB Living  1 0 0 0 0 0  SAB IAB Ectopic Multiple Live Births  0 0 0 0 0    # Outcome Date GA Lbr Len/2nd Weight Sex Delivery Anes PTL Lv  1 Current             Past Surgical History: Past Surgical History:  Procedure Laterality Date  . APPENDECTOMY      Family History: Family History  Problem Relation Age of Onset  . Healthy Mother   . Diabetes Father   . Cancer Maternal Aunt     Social History: Social History   Tobacco Use  . Smoking status: Never Smoker  . Smokeless tobacco: Never Used  Vaping Use  . Vaping Use: Never used  Substance Use Topics  . Alcohol use: Not Currently  . Drug use: Never    Allergies: No Known Allergies  Meds:  Medications Prior to Admission  Medication Sig Dispense Refill Last Dose  . aspirin EC 81 MG tablet Take 1 tablet (81 mg total) by mouth daily. 60 tablet 2 01/15/2021 at Unknown time  . labetalol (NORMODYNE) 300 MG tablet Take 1 tablet (300 mg total) by mouth 2 (two) times daily. 60 tablet 3 01/15/2021 at Unknown time  . NIFEdipine (PROCARDIA XL/NIFEDICAL XL) 60 MG 24 hr tablet Take 1 tablet (60 mg total) by mouth daily. 30 tablet 5 01/15/2021 at Unknown time  . Prenatal Vit-Fe Fumarate-FA (PRENATAL VITAMINS  PO) Take 1 tablet by mouth daily.   01/15/2021 at Unknown time  . doxylamine, Sleep, (UNISOM) 25 MG tablet Take 1 tablet (25 mg total) by mouth 4 (four) times daily as needed (nausea and vomiting). (Patient not taking: No sig reported) 30 tablet 2     I have reviewed patient's Past Medical Hx, Surgical Hx, Family Hx, Social Hx, medications and allergies.   ROS:  Review of Systems Other systems negative  Physical Exam   Patient Vitals for the past 24 hrs:  BP Temp Temp src Pulse Resp SpO2 Height Weight  01/16/21 0720 125/74 -- -- 78 -- -- -- --  01/16/21 0640 (!) 149/93 98.6 F (37 C) Oral 95 17 100 % 5\' 6"  (1.676 m) 119.7 kg   Constitutional: Well-developed, well-nourished female in no acute distress.  Cardiovascular: normal rate and rhythm Respiratory: normal effort, clear to auscultation bilaterally GI: Abd soft, non-tender, gravid appropriate for gestational age.   No rebound or guarding. MS: Extremities nontender, no edema, normal ROM Neurologic: Alert and oriented x 4.  GU: Neg CVAT.  PELVIC EXAM: Cervix pink, visually closed, without lesion, scant white creamy discharge, vaginal walls and external genitalia normal Bimanual exam: Cervix firm, posterior, neg CMT, uterus nontender, Fundal Height consistent with  dates, adnexa without tenderness, enlargement, or mass   FHT:  148   Labs: Ordered UA. O/Positive/-- (02/02 1634)  Imaging:    MAU Course/MDM: I have ordered UA to assess for urinary source of pain      Assessment: No diagnosis found.  Plan: Report given to oncoming provider  Wynelle Bourgeois CNM, MSN Certified Nurse-Midwife 01/16/2021 7:46 AM    On re-evaluation discussed patient's abdominal pain in her native language and it is very consistent with round ligament pain. Also has some dysuria and suprapubic discomfort but UA is not suggestive of infection, will send for UCx and call if she has any growth. Back pain also likely due to advancing gestational age,  no signs or symptoms of pyelo. Trial flexeril. Reviewed pathophysiology of round ligament pain with patient using pictures. All questions answered.  Discharged to home in stable condition.   10:01 AM Venora Maples, MD/MPH Attending Family Medicine Physician, [facility] for [facility], [facility]

## 2021-01-16 NOTE — MAU Note (Signed)
Pt reports lower abd cramping x 4 days, lower back pain also. Reports some pain and burning with urination. Denies bleeding.

## 2021-01-17 ENCOUNTER — Encounter: Payer: Self-pay | Admitting: *Deleted

## 2021-01-17 ENCOUNTER — Other Ambulatory Visit: Payer: Self-pay | Admitting: *Deleted

## 2021-01-17 ENCOUNTER — Ambulatory Visit: Payer: Self-pay | Admitting: *Deleted

## 2021-01-17 ENCOUNTER — Ambulatory Visit: Payer: Self-pay | Attending: Obstetrics

## 2021-01-17 VITALS — BP 120/80 | HR 99

## 2021-01-17 DIAGNOSIS — O321XX0 Maternal care for breech presentation, not applicable or unspecified: Secondary | ICD-10-CM

## 2021-01-17 DIAGNOSIS — O10912 Unspecified pre-existing hypertension complicating pregnancy, second trimester: Secondary | ICD-10-CM

## 2021-01-17 DIAGNOSIS — O99212 Obesity complicating pregnancy, second trimester: Secondary | ICD-10-CM

## 2021-01-17 DIAGNOSIS — Z362 Encounter for other antenatal screening follow-up: Secondary | ICD-10-CM

## 2021-01-17 DIAGNOSIS — Z3A22 22 weeks gestation of pregnancy: Secondary | ICD-10-CM

## 2021-01-17 DIAGNOSIS — O10012 Pre-existing essential hypertension complicating pregnancy, second trimester: Secondary | ICD-10-CM

## 2021-01-17 DIAGNOSIS — E669 Obesity, unspecified: Secondary | ICD-10-CM

## 2021-01-17 LAB — CULTURE, OB URINE: Culture: NO GROWTH

## 2021-01-20 ENCOUNTER — Ambulatory Visit (INDEPENDENT_AMBULATORY_CARE_PROVIDER_SITE_OTHER): Payer: Self-pay | Admitting: Obstetrics and Gynecology

## 2021-01-20 ENCOUNTER — Other Ambulatory Visit: Payer: Self-pay

## 2021-01-20 VITALS — BP 142/86 | HR 113 | Wt 265.5 lb

## 2021-01-20 DIAGNOSIS — R768 Other specified abnormal immunological findings in serum: Secondary | ICD-10-CM

## 2021-01-20 DIAGNOSIS — Z6841 Body Mass Index (BMI) 40.0 and over, adult: Secondary | ICD-10-CM

## 2021-01-20 DIAGNOSIS — O0990 Supervision of high risk pregnancy, unspecified, unspecified trimester: Secondary | ICD-10-CM

## 2021-01-20 DIAGNOSIS — Z3A23 23 weeks gestation of pregnancy: Secondary | ICD-10-CM

## 2021-01-20 DIAGNOSIS — O99210 Obesity complicating pregnancy, unspecified trimester: Secondary | ICD-10-CM

## 2021-01-20 DIAGNOSIS — O10919 Unspecified pre-existing hypertension complicating pregnancy, unspecified trimester: Secondary | ICD-10-CM

## 2021-01-20 DIAGNOSIS — Z789 Other specified health status: Secondary | ICD-10-CM

## 2021-01-20 NOTE — Progress Notes (Signed)
    PRENATAL VISIT NOTE  Subjective:  Joyce Barnes is a 20 y.o. G1P0000 at [redacted]w[redacted]d being seen today for ongoing prenatal care.  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  Patient reports no complaints.  Contractions: Not present. Vag. Bleeding: None.  Movement: Present. Denies leaking of fluid.   The following portions of the patient's history were reviewed and updated as appropriate: allergies, current medications, past family history, past medical history, past social history, past surgical history and problem list.   Objective:   Vitals:   01/20/21 0913  BP: (!) 142/86  Pulse: (!) 113  Weight: 265 lb 8 oz (120.4 kg)    Fetal Status: Fetal Heart Rate (bpm): 147   Movement: Present     General:  Alert, oriented and cooperative. Patient is in no acute distress.  Skin: Skin is warm and dry. No rash noted.   Cardiovascular: Normal heart rate noted  Respiratory: Normal respiratory effort, no problems with respiration noted  Abdomen: Soft, gravid, appropriate for gestational age.  Pain/Pressure: Present     Pelvic: Cervical exam deferred        Extremities: Normal range of motion.  Edema: Trace  Mental Status: Normal mood and affect. Normal behavior. Normal judgment and thought content.   Assessment and Plan:  Pregnancy: G1P0000 at [redacted]w[redacted]d 1. Chronic hypertension affecting pregnancy On labetalol 300 in the morning, procardia xl 60 at lunch and labetalol 300 in the evening. Pt confirms taking low dose ASA  I d/w her that she is borderline for increasing her labetalol; continue current regimen for now but d/w her she has a high chance of needing it increased it as some point in the pregnancy.  Has rpt growth u/s in  early july  2. Supervision of high risk pregnancy, antepartum No issues since last MAU visit  3. [redacted] weeks gestation of pregnancy  4. Language barrier Interpreter used  5. Biological false positive RPR test  6. BMI 40.0-44.9, adult (HCC)  7. Obesity in pregnancy  Preterm labor symptoms and general obstetric precautions including but not limited to vaginal bleeding, contractions, leaking of fluid and fetal movement were reviewed in detail with the patient. Please refer to After Visit Summary for other counseling recommendations.   Return in about 2 weeks (around 02/03/2021) for in person, md visit, high risk ob.  Future Appointments  Date Time Provider Department Center  02/03/2021  2:55 PM Joyce Fillers, MD [facility]  02/14/2021 11:15 AM [facility] NURSE [facility]  02/14/2021 11:30 AM [facility] US3 [facility]    Joyce Lake Bing, MD

## 2021-01-20 NOTE — Patient Instructions (Signed)
AREA PEDIATRIC/FAMILY PRACTICE PHYSICIANS  [place] ([address]) . [facility] o Chambliss, MD; Eniola, MD; Hale, MD; Hensel, MD; McDiarmid, MD; McIntyer, MD; Neal, MD; Walden, MD o [address] [place], [place] [address] o [phone] o Mon-Fri 8:30-12:30, 1:30-5:00 o Providers come to see babies at [facility] o Accepting Medicaid . [facility] o Limited providers who accept newborns: Koirala, MD; Morrow, MD; Wolters, MD o [address], [place], [place] [address] o [phone] o Mon-Fri 8:00-5:30 o Babies seen by providers at [facility] o Does NOT accept Medicaid o Please call early in hospitalization for appointment (limited availability)  . [facility] o Mulberry, MD o [address] [place], [place] [address] o [phone] o Mon, Tue, Thur, Fri 8:30-5:00, Wed 10:00-7:00 (closed 1-2pm) o Babies seen by [facility] providers o Accepting Medicaid . [facility] - Pediatrician o Rubin, MD o [address], [place], [place] [address] o [phone] o Mon-Fri 8:30-5:00, Sat 8:30-12:00 o Provider comes to see babies at [facility] o Accepting Medicaid o Must have been referred from current patients or contacted office prior to delivery . [facility] ([facility]) o Brown, MD; Chandler, MD; Ettefagh, MD; Grant, MD; Lester, MD; McCormick, MD; McQueen, MD; Prose, MD; Simha, MD; Stanley, MD; Stryffeler, NP; Tebben, NP o [address], [place], [place] [address] o [phone] o Mon, Tue, Thur, Fri 8:30-5:30, Wed 9:30-5:30, Sat 8:30-12:30 o Babies seen by [facility] providers o Accepting Medicaid o Only accepting infants of first-time parents or siblings of current patients o Hospital discharge coordinator will make follow-up appointment . Jack Amos o [address],  [place], [place]  [address] o [phone]   Fax - [phone] . [facility] o [address], [place], [place]  [address] o Phone - [phone]   Fax - [phone] . Shilpa Gosrani o [address], [place], [place]  [address] o [phone]  [place] ([address]) . [facility] o Bates, MD; [place]assfield, MD; Cooper, Cox, MD; MD; Davis, MD; Dovico, MD; Ettefaugh, MD; Little, MD; Lowe, MD; Keiffer, MD; Melvin, MD; Sumner, MD; Williams, MD o [address], [place], [place] [address] o [phone] o Mon-Fri 8:30-5:00 (extended evenings Mon-Thur as needed), Sat-Sun 10:00-1:00 o Providers come to see babies at [facility] o Accepting Medicaid for families of first-time babies and families with all children in the household age 3 and under. Must register with office prior to making appointment (M-F only). . [facility] o Henson, NP; Knapp, MD; Lalonde, MD; Tysinger, PA o [address] [place], [place] [address] o [phone] o Mon-Fri 8:00-5:00 o Babies seen by providers at [facility] o Does NOT accept Medicaid/Commercial Insurance Only . [facility] ([facility])  o Artis, MD; Barnes, MD; Bratton, MD; Coccaro, MD; Lockett [place], MD; Kramer, MD; Marshall, MD; Netherton, MD; Poleto, MD; Skinner, MD o [address] [place], [place] [address] o [phone] o Mon-Fri 8:30-5:30, Sat (Oct.-Mar.) 9:00-1:00 o Babies seen by providers at [facility] o Accepting Medicaid  [place] ([address]) . [facility] o Reid, MD; Warner, MD o [address], [place], [place] [address] o [phone] o Mon-Fri 8:30-5:00, Sat 8:30-12:00 o Providers come to see babies at [facility] o Does NOT accept Medicaid . [facility] o Becker, PA; Hagler, MD; Scifres, PA; Sun, MD; Swayne, MD o [address],  [place], [place] [address] o [phone] o Mon-Fri 8:00-5:00 o Babies seen by providers at [facility] o Does NOT accept Medicaid o Only accepting babies of parents who are patients o Please call early in hospitalization for appointment (limited availability) . [facility] o Clark, MD; Frye, MD; Kelleher, MD; Mack, NP; Miller, MD; O'Keller, MD; Patterson, NP; Pudlo, MD; Puzio, MD; Thomas, MD; Tucker, MD; Twiselton, MD o [address], [place], [place] [address] o [phone] o Mon-Fri 8:00-5:00, Sat 9:00-12:00 o Providers come to see babies at [facility] o Does NOT accept Medicaid  [place] ([address]) . [facility] o Limited providers accepting new patients: Brake, NP; Wharton, PA o [address], [place], [place] [address] o [phone] o Mon-Fri 8:00-5:00 o Babies seen by providers at [facility] o Does NOT accept Medicaid o Only accepting babies of parents who are patients o Please call early in hospitalization for appointment (limited availability) . [facility] o Gay, MD; Quinlan, MD o [address] [place], [place] [address] o [phone] (press 1 to schedule appointment) o Mon-Fri 8:00-5:00 o Providers come to see babies at [facility] o Does NOT accept Medicaid . [facility] o Mazer, MD o [address] [place], [place] [address] o [phone] o Mon-Fri 8:30-5:00 (lunch 12:30-1:00), extended hours by appointment only Wed 5:00-6:30 o Babies seen by [facility] providers o Accepting Medicaid . [facility] o Banks, MD; Jordan, MD; Koberlein, MD o [address], [place], [place] [address] o [phone] o Mon-Fri 8:00-5:00 o Babies seen by [facility] providers o Does NOT accept Medicaid . [facility] o Parker, MD; Hunter, MD; Wallace, DO o [address] [place], [place]  [address] o [phone] o Mon-Fri 8:00-5:00 o Babies seen by [facility] providers o Does NOT accept Medicaid . [facility] o Brandon, PA; Brecken, PA; Christy, NP; Dees, MD; DeClaire, MD; DeWeese, MD; Hansen, NP; Mills, NP; Parrish, NP; Smoot, NP; Summer, MD; Vapne, MD o [address] [place], [place] [address] o [phone] o Mon-Fri 8:30-5:00, Sat 10:00-1:00 o Providers come to see babies at [facility] o Does NOT accept Medicaid o Free prenatal information session Tuesdays at 4:45pm . [facility] o Bouska, MD; Gordon, PA; Jeffery, PA; Weber, PA o [address] [place] [address] o [phone] o Mon-Fri 7:30-5:30 o Babies seen by [facility] providers . [facility] o [address], [place], [place]  [address] o [phone]   Fax - [phone]  [place] ([address]) . [facility] o [place], [place] o [address] [place], [place] [address] o [phone] o Mon-Thur 8:00-6:00 o Providers come to see babies at [facility] o Accepting Medicaid . [facility] o Anderson, NP; Badger, MD; Beal, PA; Spencer, PA o [address] [place], [place] [address] o [phone] o Mon-Thur 7:30-7:30, Fri 7:30-4:30 o Babies seen by [facility] providers o Accepting Medicaid . [facility] o Agbuya, MD; Klett, NP; Romgoolam, MD o [address], [place], [place] [address] o [phone] o Mon-Fri 8:30-5:00, Sat 8:30-12:00 o Providers come to see babies at [facility] o Accepting Medicaid o Must have "Meet & Greet" appointment at office prior to delivery . [facility] o [place],   MD; Wallace, MD; Wood, MD o [address], [place], [place] [address] o [phone] o Mon-Wed 8:00-6:00, Thur-Fri 8:00-5:00, Sat 9:00-12:00 o Providers come to  see babies at [facility] o Does NOT accept Medicaid o Only accepting siblings of current patients . [facility]  o [address], [place], [place]  [address] o [phone]   Fax - [phone] . [facility] o [address], [place], [place]  [address] o [phone]   Fax - [phone]  [place] ([address]) . [facility] o Cirigliano, DO; Matthews, DO o [address] [place], [place] [address] o [phone] o Mon-Fri 7:00-5:00 o Babies seen by [facility] providers o Does NOT accept Medicaid . [facility] o Briscoe, MD; Howley, PA; Moreira, PA o [address], [place], [place] [address] o [phone] o Mon-Fri 8:00-5:00 o Babies seen by [facility] providers o Accepting Medicaid . [facility] o Boyd, MD; Church, PA; Jones, NP; Osborn, PA o [address], , [place] [address] o [phone] o Mon-Fri 8:00-5:00 o Babies seen by providers at [facility] o Accepting Medicaid  [place] ([address]) . [facility] [place] o Wendling, DO o [address] [place], [place] [address] o [phone] o Mon-Fri 8:00-5:00 o Babies seen by [facility] providers o Does NOT accept Medicaid o Limited availability, please call early in hospitalization to schedule follow-up . [facility] o Calderon, PA; Cummings, MD; Dillard, MD; Martin, PA; Olson, MD; VanDeven, PA o [address], [place], [place] [address] o [phone] o Mon-Fri 8:30-5:00, Sat 9:00-12:00 o Babies seen by providers at [facility] o Accepting Medicaid o Please register online then schedule online or call office o [email or web address] . [facility] ([facility] o Hunter, NP; Kumar, MD; Martin Rogers, PA o [address], [place], [place] [address] o [phone] o Mon-Fri 8:00-5:00 o Babies seen by providers at [facility] o Accepting Medicaid . [facility] ([facility] o Coldspring, MD; Kristi Fleenor, NP; West, MD o [address], [place], [place] [address] o [phone] o Mon-Fri 8:00-5:30, Sat&Sun by appointment (phones open at 8:30) o Babies seen by [facility] providers o Accepting Medicaid o Must be a first-time baby or sibling of current patient . [facility] [place]  o [address], [place], [place]  [address] o [phone]   Fax - [phone]  [place] ([address]) . [facility] o Brown, PA; Cowen, PA; Rice, MD; Helton, PA; Spry, MD o [address] [place], [place] [address] o [phone] o Mon-Thur 8:00-7:00, Fri 8:00-5:00, Sat 8:00-12:00, Sun 9:00-12:00 o Babies seen by [facility] providers o Accepting Medicaid . [facility] o Coe-Goins, MD; Marshall, MD; Pierre-Louis, MD o [address] [place], [place],  [address] o [phone] o Mon-Thur 8:00-5:00 o Babies seen by providers at [facility] o Accepting Medicaid . [facility] o Bratton, MD; Coe-Goins, MD; Hayes, MD; Lewis, MD; List, MD; Lott, MD; Marshall, MD; Moran, MD; O'Neal, MD; Pierre-Louis, MD; Pitonzo, MD; Scholer, MD; Spangle, MD o [address] [place],    [address] o [phone] o Mon-Fri 8:00-5:30, Sat (Oct.-Mar.) 9:00-1:00 o Babies seen by providers at [facility] o Accepting Medicaid o Must fill out new patient packet, available online at [email or web address] . [facility] ([facility]) o Friddle, NP; Harris, NP; Kelly, NP; Logan, MD;  Melvin, PA; Poth, MD; Ramadoss, MD; Stanton, NP o [address], [place], [place] [address] o [phone] o Mon-Thur 8:00-5:30, Fri 8:00-5:00 o Babies seen by providers at [facility] o Accepting Medicaid  [place] ([address]) . [facility] o Dixon, PA; [place], MD; Pickard, MD; Tapia, PA o [address], [place], [place] [address] o [phone] o Mon-Fri 8:00-5:00 o Babies seen by providers at [facility] o Accepting Medicaid   [place] ([address]) . [facility] [place] o Masneri, DO; Meyers, MD; Nelson, PA o [address], [place], [place] [address] o [phone] o Mon-Fri 8:00-5:00 o Babies seen by providers at [facility] o Does NOT accept Medicaid o Limited appointment availability, please call early in hospitalization  . [facility] [place] o Kunedd, DO; McGowen, MD o [address], [place], [place] [address] o [phone] o Mon-Fri 8:00-5:00 o Babies seen by [facility] providers o Does NOT accept Medicaid . [facility] [place] o Cameron, MD; MacDonald, MD; Michaels, PA; Nayak, MD o [address] [place] [address], [place], [place] [address] o [phone] o Mon-Fri 8:00-5:00 o After hours clinic ([address] [place], [place] [address]) [phone] Mon-Fri 5:00-8:00, Sat 12:00-6:00, Sun 10:00-4:00 o Babies seen by [facility] providers o Accepting Medicaid . [facility] [place] o [address], [place], [place]  [address] o [phone]   Fax - [phone]  [place] ([address]) . [facility] [place] o Andy, MD o [address], [place], [place] [address] o [phone] o Mon-Fri 8:00-5:00 o Babies seen by [facility] providers o Does NOT accept Medicaid . [facility] [place] ([facility] [place]) o Eksir, MD o [address], [place], [place]  [address] o [phone] o Mon-Thur 8:00-7:00, Fri 8:00-5:00, Sat 8:00-12:00 o Babies seen by providers at [facility] o Accepting Medicaid - but does not have vaccinations in office (must be received elsewhere) o Limited availability, please call early in hospitalization  [place] ([address]) . [facility]  o Charlene Flemming, MD o [address], [place] [address] o [phone]  Fax [phone]   

## 2021-01-28 ENCOUNTER — Other Ambulatory Visit (HOSPITAL_COMMUNITY)
Admission: RE | Admit: 2021-01-28 | Discharge: 2021-01-28 | Disposition: A | Discharge status: Home / Self Care | Payer: Self-pay | Source: Ambulatory Visit | Attending: Family Medicine | Admitting: Family Medicine

## 2021-01-28 ENCOUNTER — Other Ambulatory Visit: Payer: Self-pay

## 2021-01-28 ENCOUNTER — Ambulatory Visit (INDEPENDENT_AMBULATORY_CARE_PROVIDER_SITE_OTHER): Payer: Self-pay | Admitting: *Deleted

## 2021-01-28 ENCOUNTER — Telehealth: Payer: Self-pay | Admitting: Family Medicine

## 2021-01-28 VITALS — BP 142/82 | HR 100 | Ht 66.0 in | Wt 263.3 lb

## 2021-01-28 DIAGNOSIS — R3989 Other symptoms and signs involving the genitourinary system: Secondary | ICD-10-CM

## 2021-01-28 DIAGNOSIS — Z202 Contact with and (suspected) exposure to infections with a predominantly sexual mode of transmission: Secondary | ICD-10-CM

## 2021-01-28 LAB — POCT URINALYSIS DIP (DEVICE)
Bilirubin Urine: NEGATIVE
Glucose, UA: NEGATIVE mg/dL
Hgb urine dipstick: NEGATIVE
Ketones, ur: NEGATIVE mg/dL
Leukocytes,Ua: NEGATIVE
Nitrite: NEGATIVE
Protein, ur: NEGATIVE mg/dL
Specific Gravity, Urine: 1.025 (ref 1.005–1.030)
Urobilinogen, UA: 0.2 mg/dL (ref 0.0–1.0)
pH: 5.5 (ref 5.0–8.0)

## 2021-01-28 NOTE — Telephone Encounter (Signed)
Called patient back with Raquel for spanish interpretation. Patient reports urinating light green since this morning & is worried. Patient denies dysuria, flank pain,or incomplete emptying but is going to the bathroom more than usual. Patient denies watery vaginal discharge. Asked patient if she could come in for a nurse visit this afternoon & patient states yes she will be here around 345.

## 2021-01-28 NOTE — Progress Notes (Signed)
Patient is [redacted] weeks pregnant  with chtn. Came in today to give urine sample. C/o since this am when urinates urine appears light green . Denies vaginal discharge. Denies pain. Explained we will do UA and self swab to check for UTI or other infection. UA negative. Self swab collected and sent for wet prep.  BP elevated at 135/105. States she is taking both bp meds. . Denies pain or edema. C/o having some mild headaches relieved by one tylenol. Repeat bp 142/82. FHR 154. Discussed with Dr. Barb Merino. No new orders but requested instruct patient to take bp at home daily and if 150/100 go to hospital. Explained to patient and also to go to hospital if severe headache or sudden large edema. She states last bp 6 days ago was a little high but didn't write it down. Advised to bring her bp cuff so we can be sure it is accurate and log of bp's to visit on 02/03/21. She voices understanding.  Offered food market and taken to food market due to food insecurity. Also has already signed up for [facility] transportation.  Bonita Quin ,RN

## 2021-01-29 LAB — CERVICOVAGINAL ANCILLARY ONLY
Bacterial Vaginitis (gardnerella): NEGATIVE
Candida Glabrata: NEGATIVE
Candida Vaginitis: NEGATIVE
Chlamydia: NEGATIVE
Comment: NEGATIVE
Comment: NEGATIVE
Comment: NEGATIVE
Comment: NEGATIVE
Comment: NEGATIVE
Comment: NORMAL
Neisseria Gonorrhea: NEGATIVE
Trichomonas: NEGATIVE

## 2021-02-03 ENCOUNTER — Other Ambulatory Visit: Payer: Self-pay

## 2021-02-03 ENCOUNTER — Ambulatory Visit (INDEPENDENT_AMBULATORY_CARE_PROVIDER_SITE_OTHER): Payer: Self-pay | Admitting: Obstetrics and Gynecology

## 2021-02-03 VITALS — BP 141/88 | HR 93 | Wt 262.8 lb

## 2021-02-03 DIAGNOSIS — O10919 Unspecified pre-existing hypertension complicating pregnancy, unspecified trimester: Secondary | ICD-10-CM

## 2021-02-03 DIAGNOSIS — O26892 Other specified pregnancy related conditions, second trimester: Secondary | ICD-10-CM | POA: Insufficient documentation

## 2021-02-03 DIAGNOSIS — R519 Headache, unspecified: Secondary | ICD-10-CM

## 2021-02-03 DIAGNOSIS — Z3A25 25 weeks gestation of pregnancy: Secondary | ICD-10-CM | POA: Insufficient documentation

## 2021-02-03 DIAGNOSIS — O0990 Supervision of high risk pregnancy, unspecified, unspecified trimester: Secondary | ICD-10-CM

## 2021-02-03 DIAGNOSIS — Z6841 Body Mass Index (BMI) 40.0 and over, adult: Secondary | ICD-10-CM

## 2021-02-03 DIAGNOSIS — O99210 Obesity complicating pregnancy, unspecified trimester: Secondary | ICD-10-CM

## 2021-02-03 DIAGNOSIS — Z789 Other specified health status: Secondary | ICD-10-CM

## 2021-02-03 MED ORDER — LABETALOL HCL 300 MG PO TABS: 300.0000 mg | ORAL_TABLET | Freq: Three times a day (TID) | ORAL | 3 refills | Status: DC

## 2021-02-03 MED ORDER — BUTALBITAL-APAP-CAFFEINE 50-325-40 MG PO CAPS: 1.0000 | ORAL_CAPSULE | Freq: Four times a day (QID) | ORAL | 0 refills | Status: DC | PRN

## 2021-02-03 NOTE — Progress Notes (Signed)
   PRENATAL VISIT NOTE  Subjective:  Joyce Barnes is a 20 y.o. G1P0000 at [redacted]w[redacted]d being seen today for ongoing prenatal care.  She is currently monitored for the following issues for this high-risk pregnancy and has Supervision of high risk pregnancy, antepartum; Chronic hypertension affecting pregnancy; Language barrier; BMI 40.0-44.9, adult (HCC); Obesity in pregnancy; Biological false positive RPR test; [redacted] weeks gestation of pregnancy; and Headache in pregnancy, antepartum, second trimester on their problem list.  Patient doing well with no acute concerns today. She reports no complaints.  Contractions: Irritability. Vag. Bleeding: None.  Movement: Present. Denies leaking of fluid.   Mild headache noted, but pt has not taken any tylenol today.  The following portions of the patient's history were reviewed and updated as appropriate: allergies, current medications, past family history, past medical history, past social history, past surgical history and problem list. Problem list updated.  Objective:   Vitals:   02/03/21 1435  BP: (!) 141/88  Pulse: 93  Weight: 262 lb 12.8 oz (119.2 kg)    Fetal Status: Fetal Heart Rate (bpm): 148 Fundal Height: 25 cm Movement: Present     General:  Alert, oriented and cooperative. Patient is in no acute distress.  Skin: Skin is warm and dry. No rash noted.   Cardiovascular: Normal heart rate noted  Respiratory: Normal respiratory effort, no problems with respiration noted  Abdomen: Soft, gravid, appropriate for gestational age.  Pain/Pressure: Present     Pelvic: Cervical exam deferred        Extremities: Normal range of motion.  Edema: Trace  Mental Status:  Normal mood and affect. Normal behavior. Normal judgment and thought content.   Assessment and Plan:  Pregnancy: G1P0000 at [redacted]w[redacted]d  1. Chronic hypertension affecting pregnancy Labetalol increased to TID dosing - CHL AMB BABYSCRIPTS SCHEDULE OPTIMIZATION - labetalol (NORMODYNE) 300  MG tablet; Take 1 tablet (300 mg total) by mouth 3 (three) times daily.  Dispense: 90 tablet; Refill: 3  2. Supervision of high risk pregnancy, antepartum 2 hour GTT and labs next visit  3. Language barrier Interpreter present  4. BMI 40.0-44.9, adult (HCC)   5. Obesity in pregnancy   6. [redacted] weeks gestation of pregnancy   7. Headache in pregnancy, antepartum, second trimester  - Butalbital-APAP-Caffeine 50-325-40 MG capsule; Take 1-2 capsules by mouth every 6 (six) hours as needed for headache.  Dispense: 30 capsule; Refill: 0  Preterm labor symptoms and general obstetric precautions including but not limited to vaginal bleeding, contractions, leaking of fluid and fetal movement were reviewed in detail with the patient.  Please refer to After Visit Summary for other counseling recommendations.   Return in about 2 weeks (around 02/17/2021) for [facility], in person, 2 hr GTT, 3rd trim labs.   Mariel Aloe, MD Faculty Attending Center for [facility]

## 2021-02-03 NOTE — Addendum Note (Signed)
Addended by: Isabell Jarvis on: 02/03/2021 03:23 PM   Modules accepted: Orders

## 2021-02-14 ENCOUNTER — Other Ambulatory Visit: Payer: Self-pay

## 2021-02-14 ENCOUNTER — Other Ambulatory Visit: Payer: Self-pay | Admitting: *Deleted

## 2021-02-14 ENCOUNTER — Ambulatory Visit: Payer: Self-pay | Attending: Obstetrics

## 2021-02-14 ENCOUNTER — Ambulatory Visit: Payer: Self-pay | Admitting: *Deleted

## 2021-02-14 VITALS — BP 126/86 | HR 102

## 2021-02-14 DIAGNOSIS — O10912 Unspecified pre-existing hypertension complicating pregnancy, second trimester: Secondary | ICD-10-CM

## 2021-02-14 DIAGNOSIS — O99212 Obesity complicating pregnancy, second trimester: Secondary | ICD-10-CM

## 2021-02-14 DIAGNOSIS — O10012 Pre-existing essential hypertension complicating pregnancy, second trimester: Secondary | ICD-10-CM

## 2021-02-14 DIAGNOSIS — Z3A26 26 weeks gestation of pregnancy: Secondary | ICD-10-CM

## 2021-02-20 ENCOUNTER — Other Ambulatory Visit: Payer: Self-pay

## 2021-02-20 DIAGNOSIS — O0990 Supervision of high risk pregnancy, unspecified, unspecified trimester: Secondary | ICD-10-CM

## 2021-02-21 ENCOUNTER — Other Ambulatory Visit: Payer: Self-pay

## 2021-02-21 ENCOUNTER — Encounter: Payer: Self-pay | Admitting: Family Medicine

## 2021-02-21 ENCOUNTER — Ambulatory Visit (INDEPENDENT_AMBULATORY_CARE_PROVIDER_SITE_OTHER): Payer: Self-pay | Admitting: Family Medicine

## 2021-02-21 VITALS — BP 136/86 | HR 101 | Wt 266.1 lb

## 2021-02-21 DIAGNOSIS — O0990 Supervision of high risk pregnancy, unspecified, unspecified trimester: Secondary | ICD-10-CM

## 2021-02-21 DIAGNOSIS — O10919 Unspecified pre-existing hypertension complicating pregnancy, unspecified trimester: Secondary | ICD-10-CM

## 2021-02-21 DIAGNOSIS — Z23 Encounter for immunization: Secondary | ICD-10-CM

## 2021-02-21 DIAGNOSIS — Z789 Other specified health status: Secondary | ICD-10-CM

## 2021-02-21 DIAGNOSIS — O99210 Obesity complicating pregnancy, unspecified trimester: Secondary | ICD-10-CM

## 2021-02-21 NOTE — Progress Notes (Signed)
   Subjective:  Joyce Barnes is a 20 y.o. G1P0000 at [redacted]w[redacted]d being seen today for ongoing prenatal care.  She is currently monitored for the following issues for this high-risk pregnancy and has Supervision of high risk pregnancy, antepartum; Chronic hypertension affecting pregnancy; Language barrier; BMI 40.0-44.9, adult (HCC); Obesity in pregnancy; Biological false positive RPR test; [redacted] weeks gestation of pregnancy; and Headache in pregnancy, antepartum, second trimester on their problem list.  Patient reports no complaints.  Contractions: Irritability. Vag. Bleeding: None.  Movement: Present. Denies leaking of fluid.   The following portions of the patient's history were reviewed and updated as appropriate: allergies, current medications, past family history, past medical history, past social history, past surgical history and problem list. Problem list updated.  Objective:   Vitals:   02/21/21 0934  BP: 136/86  Pulse: (!) 101  Weight: 266 lb 1.6 oz (120.7 kg)    Fetal Status: Fetal Heart Rate (bpm): 153   Movement: Present     General:  Alert, oriented and cooperative. Patient is in no acute distress.  Skin: Skin is warm and dry. No rash noted.   Cardiovascular: Normal heart rate noted  Respiratory: Normal respiratory effort, no problems with respiration noted  Abdomen: Soft, gravid, appropriate for gestational age. Pain/Pressure: Absent     Pelvic: Vag. Bleeding: None     Cervical exam deferred        Extremities: Normal range of motion.  Edema: Trace  Mental Status: Normal mood and affect. Normal behavior. Normal judgment and thought content.   Urinalysis:      Assessment and Plan:  Pregnancy: G1P0000 at [redacted]w[redacted]d  1. Supervision of high risk pregnancy, antepartum BP well controlled today, FHR normal Accepts Tdap 28wk labs today Discussed contraception, referred to bedsider for more information - Tdap vaccine greater than or equal to 7yo IM  2. Chronic hypertension  affecting pregnancy Stable on labetalol 300mg  TID, Nifedipine 60mg  XL Start antenatal testing at 32 weeks  3. Language barrier Spanish  4. Obesity in pregnancy   Preterm labor symptoms and general obstetric precautions including but not limited to vaginal bleeding, contractions, leaking of fluid and fetal movement were reviewed in detail with the patient. Please refer to After Visit Summary for other counseling recommendations.  Return in 2 weeks (on 03/07/2021) for [facility], ob visit.   03/09/2021, MD

## 2021-02-21 NOTE — Patient Instructions (Signed)
Eleccin del mtodo anticonceptivo Contraception Choices La anticoncepcin, o los mtodos anticonceptivos, hace referencia a los mtodos o dispositivos que evitan el embarazo. Mtodos hormonales Implante anticonceptivo Un implante anticonceptivo consiste en un tubo delgado de plstico que contiene una hormona que evita el embarazo. Es diferente de un dispositivo intrauterino (DIU). Un mdico lo inserta en la parte superior del brazo. Los implantes pueden ser eficaces durante un mximo de 3 aos. Inyecciones de progestina sola Las inyecciones de progestina sola contienen progestina, una forma sinttica de la hormona progesterona. Un mdico las administra cada 3 meses. Pldoras anticonceptivas Las pldoras anticonceptivas son pastillas que contienen hormonas que evitan el embarazo. Deben tomarse una vez al da, preferentemente a la misma hora cada da. Se necesita una receta para utilizar este mtodo anticonceptivo. Parche anticonceptivo El parche anticonceptivo contiene hormonas que evitan el embarazo. Se coloca en la piel, debe cambiarse una vez a la semana durante tres semanas y debe retirarse en la cuarta semana. Se necesita una receta para utilizar este mtodo anticonceptivo. Anillo vaginal Un anillo vaginal contiene hormonas que evitan el embarazo. Se coloca en la vagina durante tres semanas y se retira en la cuarta semana. Luego se repite el proceso con un anillo nuevo. Se necesita una receta para utilizar este mtodo anticonceptivo. Anticonceptivo de emergencia Los anticonceptivos de emergencia son mtodos para evitar un embarazo despus de tener sexo sin proteccin. Vienen en forma de pldora y pueden tomarse hasta 5 das despus de tener sexo. Funcionan mejor cuando se toman lo ms pronto posible luego de tener sexo. La mayora de los anticonceptivos de emergencia estn disponibles sin receta mdica. Este mtodo no debe utilizarse como el nico mtodo anticonceptivo. Mtodos de  barrera Condn masculino Un condn masculino es una vaina delgada que se coloca sobre el pene durante el sexo. Los condones evitan que el esperma ingrese en el cuerpo de la mujer. Pueden utilizarse con un una sustancia que mata a los espermatozoides (espermicida) para aumentar la efectividad. Deben desecharse despus de un uso. Condn femenino Un condn femenino es una vaina blanda y holgada que se coloca en la vagina antes de tener sexo. El condn evita que el esperma ingrese en el cuerpo de la mujer. Deben desecharse despus de un uso. Diafragma Un diafragma es una barrera blanda con forma de cpula. Se inserta en la vagina antes del sexo, junto con un espermicida. El diafragma bloquea el ingreso de esperma en el tero, y el espermicida mata a los espermatozoides. El diafragma debe permanecer en la vagina durante 6 a 8 horas despus de tener sexo y debe retirarse en el plazo de las 24 horas. Un diafragma es recetado y colocado por un mdico. Debe reemplazarse cada 1 a 2 aos, despus de dar a luz, de aumentar ms de 15lb (6.8kg) y de una ciruga plvica. Capuchn cervical Un capuchn cervical es una copa redonda y blanda de ltex o plstico que se coloca en el cuello uterino. Se inserta en la vagina antes del sexo, junto con un espermicida. Bloquea el ingreso del esperma en el tero. El capuchn debe permanecer en el lugar durante 6 a 8 horas despus de tener sexo y debe retirarse en el plazo de las 48 horas. Un capuchn cervical debe ser recetado y colocado por un mdico. Debe reemplazarse cada 2aos. Esponja Una esponja es una pieza blanda y circular de espuma de poliuretano que contiene espermicida. La esponja ayuda a bloquear el ingreso de esperma en el tero, y el espermicida mata a   los espermatozoides. Para utilizarla, debe humedecerla e insertarla en la vagina. Debe insertarse antes de tener sexo, debe permanecer dentro al menos durante 6 horas despus de tener sexo y debe retirarse y  desecharse en el plazo de las 30 horas. Espermicidas Los espermicidas son sustancias qumicas que matan o bloquean al esperma y no lo dejan ingresar al cuello uterino y al tero. Vienen en forma de crema, gel, supositorio, espuma o comprimido. Un espermicida debe insertarse en la vagina con un aplicador al menos 10 o 15 minutos antes de tener sexo para dar tiempo a que surta efecto. El proceso debe repetirse cada vez que tenga sexo. Los espermicidas no requieren receta mdica. Anticonceptivos intrauterinos Dispositivo intrauterino (DIU) Un DIU es un dispositivo en forma de T que se coloca en el tero. Existen dos tipos: DIU hormonal.Este tipo contiene progestina, una forma sinttica de la hormona progesterona. Este tipo puede permanecer colocado durante 3 a 5 aos. DIU de cobre.Este tipo est recubierto con un alambre de cobre. Puede permanecer colocado durante 10 aos. Mtodos anticonceptivos permanentes Ligadura de trompas en la mujer En este mtodo, se sellan, atan u obstruyen las trompas de Falopio durante una ciruga para evitar que el vulo descienda hacia el tero. Esterilizacin histeroscpica En este mtodo, se coloca un implante pequeo y flexible dentro de cada trompa de Falopio. Los implantes hacen que se forme un tejido cicatricial en las trompas de Falopio y que las obstruya para que el espermatozoide no pueda llegar al vulo. El procedimiento demora alrededor de 3 meses para que sea efectivo. Debe utilizarse otro mtodo anticonceptivo durante esos 3 meses. Esterilizacin masculina Este es un procedimiento que consiste en atar los conductos que transportan el esperma (vasectoma). Luego del procedimiento, el hombre puede eyacular lquido (semen). Debe utilizarse otro mtodo anticonceptivo durante 3 meses despus del procedimiento. Mtodos de planificacin natural Planificacin familiar natural En este mtodo, la pareja no tiene sexo durante los das en que la mujer podra quedar  embarazada. Mtodo calendario En este mtodo, la mujer realiza un seguimiento de la duracin de cada ciclo menstrual, identifica los das en los que se puede producir un embarazo y no tiene sexo durante esos das. Mtodo de la ovulacin En este mtodo, la pareja evita tener sexo durante la ovulacin. Mtodo sintotrmico Este mtodo implica no tener sexo durante la ovulacin. Normalmente, la mujer comprueba la ovulacin al observar cambios en su temperatura y en la consistencia del moco cervical. Mtodo posovulacin En este mtodo, la pareja espera a que finalice la ovulacin para tener sexo. Dnde buscar ms informacin Centers for Disease Control and Prevention (Centros para el Control y la Prevencin de Enfermedades): www.cdc.gov Resumen La anticoncepcin, o los mtodos anticonceptivos, hace referencia a los mtodos o dispositivos que evitan el embarazo. Los mtodos anticonceptivos hormonales incluyen implantes, inyecciones, pastillas, parches, anillos vaginales y anticonceptivos de emergencia. Los mtodos anticonceptivos de barrera pueden incluir condones masculinos, condones femeninos, diafragmas, capuchones cervicales, esponjas y espermicidas. Existen dos tipos de DIU (dispositivo intrauterino). Un DIU puede colocarse en el tero de una mujer para evitar el embarazo durante 3 a 5 aos. La esterilizacin permanente puede realizarse mediante un procedimiento tanto en los hombres como en las mujeres. Los mtodos de planificacin familiar natural implican no tener sexo durante los das en que la mujer podra quedar embarazada. Esta informacin no tiene como fin reemplazar el consejo del mdico. Asegrese de hacerle al mdico cualquier pregunta que tenga. Document Revised: 03/05/2020 Document Reviewed: 03/05/2020 Elsevier Patient Education  2022 Elsevier   Inc.  

## 2021-02-22 LAB — CBC
Hematocrit: 37.2 % (ref 34.0–46.6)
Hemoglobin: 12.6 g/dL (ref 11.1–15.9)
MCH: 29.3 pg (ref 26.6–33.0)
MCHC: 33.9 g/dL (ref 31.5–35.7)
MCV: 87 fL (ref 79–97)
Platelets: 311 10*3/uL (ref 150–450)
RBC: 4.3 x10E6/uL (ref 3.77–5.28)
RDW: 12.3 % (ref 11.7–15.4)
WBC: 10.9 10*3/uL — ABNORMAL HIGH (ref 3.4–10.8)

## 2021-02-22 LAB — RPR: RPR Ser Ql: NONREACTIVE

## 2021-02-22 LAB — GLUCOSE TOLERANCE, 2 HOURS W/ 1HR
Glucose, 1 hour: 118 mg/dL (ref 65–179)
Glucose, 2 hour: 112 mg/dL (ref 65–152)
Glucose, Fasting: 81 mg/dL (ref 65–91)

## 2021-02-22 LAB — HIV ANTIBODY (ROUTINE TESTING W REFLEX): HIV Screen 4th Generation wRfx: NONREACTIVE

## 2021-02-26 ENCOUNTER — Telehealth: Payer: Self-pay

## 2021-02-26 NOTE — Telephone Encounter (Signed)
Called pt with Joyce R., in regards to her MyChart message left in Spanish.   Pt reports that she had a couple of drops of blood at 4 am and has not had another episode.  I advised pt that it can be normal after intercourse or recent U/S.  I advised pt to continue to monitor and if it continues to spot or the vaginal bleeding increases to please contact the office.  If not then we will evaluate her at her appt.    Addison Naegeli, RN  02/26/21

## 2021-03-05 ENCOUNTER — Encounter (HOSPITAL_COMMUNITY): Payer: Self-pay | Admitting: Obstetrics & Gynecology

## 2021-03-05 ENCOUNTER — Inpatient Hospital Stay (HOSPITAL_COMMUNITY)
Admission: AD | Admit: 2021-03-05 | Discharge: 2021-03-05 | Disposition: A | Discharge status: Home / Self Care | Payer: Self-pay | Attending: Obstetrics & Gynecology | Admitting: Obstetrics & Gynecology

## 2021-03-05 ENCOUNTER — Telehealth: Payer: Self-pay

## 2021-03-05 ENCOUNTER — Other Ambulatory Visit: Payer: Self-pay

## 2021-03-05 DIAGNOSIS — O26893 Other specified pregnancy related conditions, third trimester: Secondary | ICD-10-CM | POA: Insufficient documentation

## 2021-03-05 DIAGNOSIS — O10919 Unspecified pre-existing hypertension complicating pregnancy, unspecified trimester: Secondary | ICD-10-CM

## 2021-03-05 DIAGNOSIS — Z3A29 29 weeks gestation of pregnancy: Secondary | ICD-10-CM | POA: Insufficient documentation

## 2021-03-05 DIAGNOSIS — O10913 Unspecified pre-existing hypertension complicating pregnancy, third trimester: Secondary | ICD-10-CM | POA: Insufficient documentation

## 2021-03-05 DIAGNOSIS — R519 Headache, unspecified: Secondary | ICD-10-CM | POA: Insufficient documentation

## 2021-03-05 LAB — COMPREHENSIVE METABOLIC PANEL
ALT: 14 U/L (ref 0–44)
AST: 17 U/L (ref 15–41)
Albumin: 3.4 g/dL — ABNORMAL LOW (ref 3.5–5.0)
Alkaline Phosphatase: 94 U/L (ref 38–126)
Anion gap: 7 (ref 5–15)
BUN: <5 mg/dL — ABNORMAL LOW (ref 6–20)
CO2: 22 mmol/L (ref 22–32)
Calcium: 9.3 mg/dL (ref 8.9–10.3)
Chloride: 106 mmol/L (ref 98–111)
Creatinine, Ser: 0.51 mg/dL (ref 0.44–1.00)
GFR, Estimated: >60 mL/min (ref >60)
Glucose, Bld: 94 mg/dL (ref 70–99)
Potassium: 3.6 mmol/L (ref 3.5–5.1)
Sodium: 135 mmol/L (ref 135–145)
Total Bilirubin: 0.6 mg/dL (ref 0.3–1.2)
Total Protein: 6.6 g/dL (ref 6.5–8.1)

## 2021-03-05 LAB — URINALYSIS, ROUTINE W REFLEX MICROSCOPIC
Bilirubin Urine: NEGATIVE
Glucose, UA: NEGATIVE mg/dL
Hgb urine dipstick: NEGATIVE
Ketones, ur: NEGATIVE mg/dL
Leukocytes,Ua: NEGATIVE
Nitrite: NEGATIVE
Protein, ur: NEGATIVE mg/dL
Specific Gravity, Urine: 1.004 — ABNORMAL LOW (ref 1.005–1.030)
pH: 8 (ref 5.0–8.0)

## 2021-03-05 LAB — CBC
HCT: 37.4 % (ref 36.0–46.0)
Hemoglobin: 12.5 g/dL (ref 12.0–15.0)
MCH: 28.7 pg (ref 26.0–34.0)
MCHC: 33.4 g/dL (ref 30.0–36.0)
MCV: 86 fL (ref 80.0–100.0)
Platelets: 319 10*3/uL (ref 150–400)
RBC: 4.35 MIL/uL (ref 3.87–5.11)
RDW: 12.8 % (ref 11.5–15.5)
WBC: 11.1 10*3/uL — ABNORMAL HIGH (ref 4.0–10.5)
nRBC: 0 % (ref 0.0–0.2)

## 2021-03-05 LAB — PROTEIN / CREATININE RATIO, URINE
Creatinine, Urine: 24.17 mg/dL
Total Protein, Urine: <6 mg/dL

## 2021-03-05 MED ORDER — LABETALOL HCL 5 MG/ML IV SOLN: 20.0000 mg | INTRAVENOUS | Status: DC | PRN

## 2021-03-05 MED ORDER — HYDRALAZINE HCL 20 MG/ML IJ SOLN: 10.0000 mg | INTRAMUSCULAR | Status: DC | PRN

## 2021-03-05 MED ORDER — LABETALOL HCL 5 MG/ML IV SOLN
80.0000 mg | INTRAVENOUS | Status: DC | PRN
Start: 2021-03-05 — End: 2021-03-05

## 2021-03-05 MED ORDER — LABETALOL HCL 5 MG/ML IV SOLN: 40.0000 mg | INTRAVENOUS | Status: DC | PRN

## 2021-03-05 NOTE — Telephone Encounter (Signed)
Call placed to pt. Pt states having headache x 3 days. Pt states has been taking Labetalol 300mg  TID as directed and headaches come back. Pt also states having elevated BP at home.  Instructed pt to take BP now. BP today: 153/101. Pt states took last BP med at 1230pm today.  Pt denies any other symptoms at this time.  Per protocol, pt advised to go to MAU for evaluation since 29+weeks pregnant. Pt voiced understanding and agreeable to plan of care.  , RN   Spoke with pt using Eda, in-house interpreter.

## 2021-03-05 NOTE — MAU Provider Note (Signed)
Chief Complaint  Patient presents with   Headache   BP Evaluation     Event Date/Time   First Provider Initiated Contact with Patient 03/05/21 1714      S: Joyce Barnes  is a 20 y.o. y.o. year old G53P0000 female at [redacted]w[redacted]d weeks gestation who presents to MAU with elevated blood pressures. Hx of chronic hypertension. Current blood pressure medication: labetalol & nifedipine  Associated symptoms: denies Headache, denies vision changes, denies epigastric pain Contractions: denies Vaginal bleeding: denies Fetal movement: good  O: Patient Vitals for the past 24 hrs:  BP Temp Temp src Pulse Resp SpO2 Weight  03/05/21 1816 126/76 -- -- 88 -- -- --  03/05/21 1800 126/70 -- -- 90 -- -- --  03/05/21 1757 134/74 -- -- 90 -- -- --  03/05/21 1716 131/74 -- -- 90 -- -- --  03/05/21 1701 135/76 -- -- 98 -- -- --  03/05/21 1650 (!) 147/84 -- -- (!) 107 -- -- --  03/05/21 1633 (!) 163/92 98.2 F (36.8 C) Oral (!) 121 20 100 % --  03/05/21 1623 -- -- -- -- -- -- 122.4 kg   General: NAD Heart: Regular rate Lungs: Normal rate and effort Abd: Soft, NT, Gravid, S=D Extremities: no Pedal edema Neuro: 2+ deep tendon reflexes, No clonus  Fetal Tracing:  Baseline: 140 Variability: moderate Accelerations: 15x15 Decelerations: none  Toco: irregular    Results for orders placed or performed during the hospital encounter of 03/05/21 (from the past 24 hour(s))  Urinalysis, Routine w reflex microscopic Urine, Clean Catch     Status: Abnormal   Collection Time: 03/05/21  4:36 PM  Result Value Ref Range   Color, Urine STRAW (A) YELLOW   APPearance CLEAR CLEAR   Specific Gravity, Urine 1.004 (L) 1.005 - 1.030   pH 8.0 5.0 - 8.0   Glucose, UA NEGATIVE NEGATIVE mg/dL   Hgb urine dipstick NEGATIVE NEGATIVE   Bilirubin Urine NEGATIVE NEGATIVE   Ketones, ur NEGATIVE NEGATIVE mg/dL   Protein, ur NEGATIVE NEGATIVE mg/dL   Nitrite NEGATIVE NEGATIVE   Leukocytes,Ua NEGATIVE NEGATIVE  Protein /  creatinine ratio, urine     Status: None   Collection Time: 03/05/21  4:37 PM  Result Value Ref Range   Creatinine, Urine 24.17 mg/dL   Total Protein, Urine <6.0 mg/dL   Protein Creatinine Ratio        0.00 - 0.15 mg/mg[Cre]  CBC     Status: Abnormal   Collection Time: 03/05/21  4:51 PM  Result Value Ref Range   WBC 11.1 (H) 4.0 - 10.5 K/uL   RBC 4.35 3.87 - 5.11 MIL/uL   Hemoglobin 12.5 12.0 - 15.0 g/dL   HCT 66.5 99.3 - 57.0 %   MCV 86.0 80.0 - 100.0 fL   MCH 28.7 26.0 - 34.0 pg   MCHC 33.4 30.0 - 36.0 g/dL   RDW 17.7 93.9 - 03.0 %   Platelets 319 150 - 400 K/uL   nRBC 0.0 0.0 - 0.2 %  Comprehensive metabolic panel     Status: Abnormal   Collection Time: 03/05/21  4:51 PM  Result Value Ref Range   Sodium 135 135 - 145 mmol/L   Potassium 3.6 3.5 - 5.1 mmol/L   Chloride 106 98 - 111 mmol/L   CO2 22 22 - 32 mmol/L   Glucose, Bld 94 70 - 99 mg/dL   BUN <5 (L) 6 - 20 mg/dL   Creatinine, Ser 0.92 0.44 - 1.00 mg/dL  Calcium 9.3 8.9 - 10.3 mg/dL   Total Protein 6.6 6.5 - 8.1 g/dL   Albumin 3.4 (L) 3.5 - 5.0 g/dL   AST 17 15 - 41 U/L   ALT 14 0 - 44 U/L   Alkaline Phosphatase 94 38 - 126 U/L   Total Bilirubin 0.6 0.3 - 1.2 mg/dL   GFR, Estimated >62 >70 mL/min   Anion gap 7 5 - 15    MDM Intake BP was severe but remaining BPs were normal. She is asymptomatic & preeclampsia labs normal. Change in medication not indicated at this time.   Spanish interpreter used for this encounter  A:  1. Chronic pre-existing hypertension during pregnancy   2. [redacted] weeks gestation of pregnancy     P:  Discharge home in stable condition Reviewed s/s of preeclampsia & reasons to return to MAU  Judeth Horn, NP 03/05/2021 6:40 PM

## 2021-03-05 NOTE — MAU Note (Signed)
Presents for BP evaluation.  Reports has Hx CHTN and has a H/A.  Also took BP A home, BP 152/101.  Denies blurred vision and epigastric pain.  Reports dizziness yesterday. Endorses +FM.  Denies VB or LOF.

## 2021-03-07 ENCOUNTER — Encounter: Payer: Self-pay | Admitting: Family Medicine

## 2021-03-14 ENCOUNTER — Other Ambulatory Visit: Payer: Self-pay | Admitting: *Deleted

## 2021-03-14 ENCOUNTER — Ambulatory Visit: Payer: Self-pay | Admitting: *Deleted

## 2021-03-14 ENCOUNTER — Other Ambulatory Visit: Payer: Self-pay

## 2021-03-14 ENCOUNTER — Ambulatory Visit: Payer: Self-pay | Attending: Obstetrics

## 2021-03-14 VITALS — BP 142/67 | HR 98

## 2021-03-14 DIAGNOSIS — O10013 Pre-existing essential hypertension complicating pregnancy, third trimester: Secondary | ICD-10-CM

## 2021-03-14 DIAGNOSIS — O10912 Unspecified pre-existing hypertension complicating pregnancy, second trimester: Secondary | ICD-10-CM | POA: Insufficient documentation

## 2021-03-14 DIAGNOSIS — O99213 Obesity complicating pregnancy, third trimester: Secondary | ICD-10-CM

## 2021-03-14 DIAGNOSIS — O10913 Unspecified pre-existing hypertension complicating pregnancy, third trimester: Secondary | ICD-10-CM

## 2021-03-14 DIAGNOSIS — O10919 Unspecified pre-existing hypertension complicating pregnancy, unspecified trimester: Secondary | ICD-10-CM

## 2021-03-14 DIAGNOSIS — Z3A30 30 weeks gestation of pregnancy: Secondary | ICD-10-CM

## 2021-03-21 ENCOUNTER — Ambulatory Visit: Payer: Self-pay | Admitting: *Deleted

## 2021-03-21 ENCOUNTER — Other Ambulatory Visit: Payer: Self-pay

## 2021-03-21 ENCOUNTER — Ambulatory Visit: Payer: Self-pay | Attending: Obstetrics and Gynecology | Admitting: *Deleted

## 2021-03-21 ENCOUNTER — Encounter: Payer: Self-pay | Admitting: *Deleted

## 2021-03-21 VITALS — BP 132/71 | HR 92

## 2021-03-21 DIAGNOSIS — Z3A30 30 weeks gestation of pregnancy: Secondary | ICD-10-CM

## 2021-03-21 DIAGNOSIS — O10919 Unspecified pre-existing hypertension complicating pregnancy, unspecified trimester: Secondary | ICD-10-CM

## 2021-03-21 DIAGNOSIS — Z3A31 31 weeks gestation of pregnancy: Secondary | ICD-10-CM | POA: Insufficient documentation

## 2021-03-21 DIAGNOSIS — O10913 Unspecified pre-existing hypertension complicating pregnancy, third trimester: Secondary | ICD-10-CM | POA: Insufficient documentation

## 2021-03-21 NOTE — Procedures (Signed)
Joyce Barnes 15-Jan-2001 [redacted]w[redacted]d  Fetus A Non-Stress Test Interpretation for 03/21/21  Indication: Chronic Hypertenstion  Fetal Heart Rate A Mode: External Baseline Rate (A): 135 bpm Variability: Moderate Accelerations: 15 x 15 Decelerations: None Multiple birth?: No  Uterine Activity Mode: Palpation Contraction Frequency (min): none Resting Tone Palpated: Relaxed Resting Time: Adequate  Interpretation (Fetal Testing) Nonstress Test Interpretation: Reactive Overall Impression: Reassuring for gestational age Comments: Dr. Judeth Cornfield reviewed tracing.

## 2021-03-24 ENCOUNTER — Other Ambulatory Visit: Payer: Self-pay

## 2021-03-24 ENCOUNTER — Ambulatory Visit (INDEPENDENT_AMBULATORY_CARE_PROVIDER_SITE_OTHER): Payer: Self-pay | Admitting: Obstetrics and Gynecology

## 2021-03-24 VITALS — BP 134/83 | HR 96 | Wt 267.0 lb

## 2021-03-24 DIAGNOSIS — R768 Other specified abnormal immunological findings in serum: Secondary | ICD-10-CM

## 2021-03-24 DIAGNOSIS — O0990 Supervision of high risk pregnancy, unspecified, unspecified trimester: Secondary | ICD-10-CM

## 2021-03-24 DIAGNOSIS — Z5941 Food insecurity: Secondary | ICD-10-CM

## 2021-03-24 DIAGNOSIS — Z603 Acculturation difficulty: Secondary | ICD-10-CM

## 2021-03-24 DIAGNOSIS — O10919 Unspecified pre-existing hypertension complicating pregnancy, unspecified trimester: Secondary | ICD-10-CM

## 2021-03-24 DIAGNOSIS — Z3A32 32 weeks gestation of pregnancy: Secondary | ICD-10-CM

## 2021-03-24 DIAGNOSIS — Z6841 Body Mass Index (BMI) 40.0 and over, adult: Secondary | ICD-10-CM

## 2021-03-24 DIAGNOSIS — Z789 Other specified health status: Secondary | ICD-10-CM

## 2021-03-24 NOTE — Progress Notes (Signed)
   PRENATAL VISIT NOTE  Subjective:  Joyce Barnes is a 20 y.o. G1P0000 at [redacted]w[redacted]d being seen today for ongoing prenatal care.  She is currently monitored for the following issues for this high-risk pregnancy and has Supervision of high risk pregnancy, antepartum; Chronic hypertension affecting pregnancy; Language barrier; BMI 40.0-44.9, adult (HCC); Obesity in pregnancy; Biological false positive RPR test; [redacted] weeks gestation of pregnancy; Headache in pregnancy, antepartum, second trimester; and [redacted] weeks gestation of pregnancy on their problem list.  Patient doing well with no acute concerns today. She reports no complaints.  Contractions: Irritability. Vag. Bleeding: None.  Movement: Present. Denies leaking of fluid.   The following portions of the patient's history were reviewed and updated as appropriate: allergies, current medications, past family history, past medical history, past social history, past surgical history and problem list. Problem list updated.  Objective:   Vitals:   03/24/21 1322  BP: 134/83  Pulse: 96  Weight: 267 lb (121.1 kg)    Fetal Status: Fetal Heart Rate (bpm): 148 Fundal Height: 32 cm Movement: Present     General:  Alert, oriented and cooperative. Patient is in no acute distress.  Skin: Skin is warm and dry. No rash noted.   Cardiovascular: Normal heart rate noted  Respiratory: Normal respiratory effort, no problems with respiration noted  Abdomen: Soft, gravid, appropriate for gestational age.  Pain/Pressure: Present     Pelvic: Cervical exam deferred        Extremities: Normal range of motion.  Edema: Trace  Mental Status:  Normal mood and affect. Normal behavior. Normal judgment and thought content.   Assessment and Plan:  Pregnancy: G1P0000 at [redacted]w[redacted]d  1. [redacted] weeks gestation of pregnancy   2. Chronic hypertension affecting pregnancy Pt continues with labetalol, pt started weekly BPP/NST this week  3. Supervision of high risk pregnancy,  antepartum Continue routine care  4. Language barrier Interpreter present  5. BMI 40.0-44.9, adult (HCC)   6. Biological false positive RPR test   7. Food insecurity  - AMBULATORY REFERRAL TO [facility]  Preterm labor symptoms and general obstetric precautions including but not limited to vaginal bleeding, contractions, leaking of fluid and fetal movement were reviewed in detail with the patient.  Please refer to After Visit Summary for other counseling recommendations.   Return in about 2 weeks (around 04/07/2021) for [facility], in person.   Mariel Aloe, MD Faculty Attending Center for [facility]

## 2021-03-26 ENCOUNTER — Other Ambulatory Visit: Payer: Self-pay | Admitting: *Deleted

## 2021-03-26 ENCOUNTER — Ambulatory Visit: Payer: Self-pay | Admitting: *Deleted

## 2021-03-26 ENCOUNTER — Encounter: Payer: Self-pay | Admitting: *Deleted

## 2021-03-26 ENCOUNTER — Other Ambulatory Visit: Payer: Self-pay

## 2021-03-26 ENCOUNTER — Ambulatory Visit: Payer: Self-pay | Attending: Obstetrics

## 2021-03-26 VITALS — BP 133/75 | HR 99

## 2021-03-26 DIAGNOSIS — O10913 Unspecified pre-existing hypertension complicating pregnancy, third trimester: Secondary | ICD-10-CM

## 2021-03-26 DIAGNOSIS — Z6841 Body Mass Index (BMI) 40.0 and over, adult: Secondary | ICD-10-CM

## 2021-04-02 ENCOUNTER — Other Ambulatory Visit: Payer: Self-pay

## 2021-04-02 ENCOUNTER — Ambulatory Visit: Payer: Self-pay | Attending: Obstetrics

## 2021-04-02 ENCOUNTER — Ambulatory Visit: Payer: Self-pay | Admitting: *Deleted

## 2021-04-02 ENCOUNTER — Encounter: Payer: Self-pay | Admitting: *Deleted

## 2021-04-02 VITALS — BP 143/79 | HR 91

## 2021-04-02 DIAGNOSIS — O10913 Unspecified pre-existing hypertension complicating pregnancy, third trimester: Secondary | ICD-10-CM | POA: Insufficient documentation

## 2021-04-08 ENCOUNTER — Other Ambulatory Visit: Payer: Self-pay

## 2021-04-08 ENCOUNTER — Ambulatory Visit (INDEPENDENT_AMBULATORY_CARE_PROVIDER_SITE_OTHER): Payer: Self-pay | Admitting: Certified Nurse Midwife

## 2021-04-08 VITALS — BP 137/84 | HR 108 | Wt 270.4 lb

## 2021-04-08 DIAGNOSIS — O0993 Supervision of high risk pregnancy, unspecified, third trimester: Secondary | ICD-10-CM

## 2021-04-08 DIAGNOSIS — O10919 Unspecified pre-existing hypertension complicating pregnancy, unspecified trimester: Secondary | ICD-10-CM

## 2021-04-08 DIAGNOSIS — Z3A34 34 weeks gestation of pregnancy: Secondary | ICD-10-CM

## 2021-04-09 NOTE — Progress Notes (Signed)
   PRENATAL VISIT NOTE  Subjective:  Joyce Barnes is a 20 y.o. G1P0000 at [redacted]w[redacted]d being seen today for ongoing prenatal care.  She is currently monitored for the following issues for this high-risk pregnancy and has Supervision of high risk pregnancy, antepartum; Chronic hypertension affecting pregnancy; Language barrier; BMI 40.0-44.9, adult (HCC); Obesity in pregnancy; Biological false positive RPR test; [redacted] weeks gestation of pregnancy; Headache in pregnancy, antepartum, second trimester; and [redacted] weeks gestation of pregnancy on their problem list.  Patient reports no complaints.  Contractions: Irritability. Vag. Bleeding: None.  Movement: Present. Denies leaking of fluid.   The following portions of the patient's history were reviewed and updated as appropriate: allergies, current medications, past family history, past medical history, past social history, past surgical history and problem list.   Objective:   Vitals:   04/08/21 1105  BP: 137/84  Pulse: (!) 108  Weight: 270 lb 6.4 oz (122.7 kg)    Fetal Status: Fetal Heart Rate (bpm): 146 Fundal Height: 34 cm Movement: Present     General:  Alert, oriented and cooperative. Patient is in no acute distress.  Skin: Skin is warm and dry. No rash noted.   Cardiovascular: Normal heart rate noted  Respiratory: Normal respiratory effort, no problems with respiration noted  Abdomen: Soft, gravid, appropriate for gestational age.  Pain/Pressure: Present     Pelvic: Cervical exam deferred        Extremities: Normal range of motion.  Edema: None  Mental Status: Normal mood and affect. Normal behavior. Normal judgment and thought content.   Assessment and Plan:  Pregnancy: G1P0000 at [redacted]w[redacted]d 1. Pregnancy, supervision, high-risk, third trimester - Doing well, feeling regular and vigorous fetal movement  2. [redacted] weeks gestation of pregnancy - Routine OB care  3. Chronic hypertension affecting pregnancy - BP stable on labetalol, no s/sx of  PEC today  Preterm labor symptoms and general obstetric precautions including but not limited to vaginal bleeding, contractions, leaking of fluid and fetal movement were reviewed in detail with the patient. Please refer to After Visit Summary for other counseling recommendations.   Return in about 2 weeks (around 04/22/2021) for IN-PERSON, HOB/GBS.  Future Appointments  Date Time Provider Department Center  04/11/2021  8:30 AM [facility] NURSE [facility]  04/11/2021  8:45 AM [facility] US5 [facility]  04/16/2021 12:30 PM [facility] NURSE [facility]  04/16/2021 12:45 PM [facility] US5 [facility]  04/22/2021 10:35 AM Venora Maples, MD [facility]  04/23/2021  9:30 AM [facility] NURSE [facility]  04/23/2021  9:45 AM [facility] US5 [facility]  04/30/2021 10:45 AM [facility] NURSE [facility]  04/30/2021 11:00 AM [facility] US1 [facility]    Bernerd Limbo, CNM

## 2021-04-11 ENCOUNTER — Other Ambulatory Visit: Payer: Self-pay

## 2021-04-11 ENCOUNTER — Ambulatory Visit: Payer: Self-pay | Attending: Obstetrics

## 2021-04-11 ENCOUNTER — Encounter: Payer: Self-pay | Admitting: *Deleted

## 2021-04-11 ENCOUNTER — Ambulatory Visit: Payer: Self-pay | Admitting: *Deleted

## 2021-04-11 VITALS — BP 133/71 | HR 103

## 2021-04-11 DIAGNOSIS — O99213 Obesity complicating pregnancy, third trimester: Secondary | ICD-10-CM

## 2021-04-11 DIAGNOSIS — O10913 Unspecified pre-existing hypertension complicating pregnancy, third trimester: Secondary | ICD-10-CM | POA: Insufficient documentation

## 2021-04-11 DIAGNOSIS — Z362 Encounter for other antenatal screening follow-up: Secondary | ICD-10-CM

## 2021-04-11 DIAGNOSIS — O10013 Pre-existing essential hypertension complicating pregnancy, third trimester: Secondary | ICD-10-CM

## 2021-04-11 DIAGNOSIS — Z6841 Body Mass Index (BMI) 40.0 and over, adult: Secondary | ICD-10-CM | POA: Insufficient documentation

## 2021-04-11 DIAGNOSIS — Z3A34 34 weeks gestation of pregnancy: Secondary | ICD-10-CM

## 2021-04-16 ENCOUNTER — Ambulatory Visit: Payer: Self-pay | Attending: Obstetrics and Gynecology

## 2021-04-16 ENCOUNTER — Encounter: Payer: Self-pay | Admitting: *Deleted

## 2021-04-16 ENCOUNTER — Ambulatory Visit: Payer: Self-pay | Admitting: *Deleted

## 2021-04-16 ENCOUNTER — Other Ambulatory Visit: Payer: Self-pay

## 2021-04-16 VITALS — BP 121/69 | HR 91

## 2021-04-16 DIAGNOSIS — Z3A35 35 weeks gestation of pregnancy: Secondary | ICD-10-CM

## 2021-04-16 DIAGNOSIS — Z6841 Body Mass Index (BMI) 40.0 and over, adult: Secondary | ICD-10-CM | POA: Insufficient documentation

## 2021-04-16 DIAGNOSIS — O10913 Unspecified pre-existing hypertension complicating pregnancy, third trimester: Secondary | ICD-10-CM

## 2021-04-16 DIAGNOSIS — E669 Obesity, unspecified: Secondary | ICD-10-CM

## 2021-04-16 DIAGNOSIS — O99213 Obesity complicating pregnancy, third trimester: Secondary | ICD-10-CM

## 2021-04-16 DIAGNOSIS — O10013 Pre-existing essential hypertension complicating pregnancy, third trimester: Secondary | ICD-10-CM

## 2021-04-22 ENCOUNTER — Other Ambulatory Visit (HOSPITAL_COMMUNITY)
Admission: RE | Admit: 2021-04-22 | Discharge: 2021-04-22 | Disposition: A | Discharge status: Home / Self Care | Payer: Medicaid Other | Source: Ambulatory Visit | Attending: Family Medicine | Admitting: Family Medicine

## 2021-04-22 ENCOUNTER — Other Ambulatory Visit: Payer: Self-pay

## 2021-04-22 ENCOUNTER — Encounter: Payer: Self-pay | Admitting: Family Medicine

## 2021-04-22 ENCOUNTER — Ambulatory Visit (INDEPENDENT_AMBULATORY_CARE_PROVIDER_SITE_OTHER): Payer: Self-pay | Admitting: Family Medicine

## 2021-04-22 VITALS — BP 133/84 | HR 102 | Wt 266.0 lb

## 2021-04-22 DIAGNOSIS — O99210 Obesity complicating pregnancy, unspecified trimester: Secondary | ICD-10-CM

## 2021-04-22 DIAGNOSIS — O0990 Supervision of high risk pregnancy, unspecified, unspecified trimester: Secondary | ICD-10-CM

## 2021-04-22 DIAGNOSIS — R768 Other specified abnormal immunological findings in serum: Secondary | ICD-10-CM

## 2021-04-22 DIAGNOSIS — Z789 Other specified health status: Secondary | ICD-10-CM

## 2021-04-22 DIAGNOSIS — Z23 Encounter for immunization: Secondary | ICD-10-CM

## 2021-04-22 DIAGNOSIS — O10919 Unspecified pre-existing hypertension complicating pregnancy, unspecified trimester: Secondary | ICD-10-CM

## 2021-04-22 NOTE — Progress Notes (Signed)
   Subjective:  Joyce Barnes is a 20 y.o. G1P0000 at [redacted]w[redacted]d being seen today for ongoing prenatal care.  She is currently monitored for the following issues for this high-risk pregnancy and has Supervision of high risk pregnancy, antepartum; Chronic hypertension affecting pregnancy; Language barrier; BMI 40.0-44.9, adult (HCC); Obesity in pregnancy; Biological false positive RPR test; [redacted] weeks gestation of pregnancy; Headache in pregnancy, antepartum, second trimester; and [redacted] weeks gestation of pregnancy on their problem list.  Patient reports headache.  Contractions: Irritability. Vag. Bleeding: None.  Movement: Present. Denies leaking of fluid.   The following portions of the patient's history were reviewed and updated as appropriate: allergies, current medications, past family history, past medical history, past social history, past surgical history and problem list. Problem list updated.  Objective:   Vitals:   04/22/21 1040  BP: 133/84  Pulse: (!) 102  Weight: 120.7 kg    Fetal Status: Fetal Heart Rate (bpm): 136   Movement: Present     General:  Alert, oriented and cooperative. Patient is in no acute distress.  Skin: Skin is warm and dry. No rash noted.   Cardiovascular: Normal heart rate noted  Respiratory: Normal respiratory effort, no problems with respiration noted  Abdomen: Soft, gravid, appropriate for gestational age. Pain/Pressure: Present     Pelvic: Vag. Bleeding: None     Cervical exam deferred        Extremities: Normal range of motion.  Edema: None  Mental Status: Normal mood and affect. Normal behavior. Normal judgment and thought content.   Urinalysis:      Assessment and Plan:  Pregnancy: G1P0000 at [redacted]w[redacted]d  1. Supervision of high risk pregnancy, antepartum BP and FHR normal Swabs collected today Confirmed cephalic on bedside US  2. Chronic hypertension affecting pregnancy BP stable on Nifedipine 60 XL and Labetalol 300 mg TID Reports no headache  currently but getting them frequently Takes tylenol, helps a little No longer taking fioricet Has not tried flexeril for headache, recommended this Reviewed PreE warning signs Discussed IOL at 39 weeks, schedule at next visit  3. Language barrier Spanish  4. Biological false positive RPR test Neg T.pall Ab's  5. Obesity in pregnancy   Preterm labor symptoms and general obstetric precautions including but not limited to vaginal bleeding, contractions, leaking of fluid and fetal movement were reviewed in detail with the patient. Please refer to After Visit Summary for other counseling recommendations.  Return in 1 week (on 04/29/2021) for [facility], ob visit, needs MD.   Venora Maples, MD

## 2021-04-23 ENCOUNTER — Ambulatory Visit: Payer: Medicaid Other | Attending: Obstetrics

## 2021-04-23 ENCOUNTER — Encounter: Payer: Self-pay | Admitting: *Deleted

## 2021-04-23 ENCOUNTER — Other Ambulatory Visit: Payer: Self-pay | Admitting: *Deleted

## 2021-04-23 ENCOUNTER — Ambulatory Visit: Payer: Medicaid Other | Admitting: *Deleted

## 2021-04-23 VITALS — BP 127/68 | HR 92

## 2021-04-23 DIAGNOSIS — Z362 Encounter for other antenatal screening follow-up: Secondary | ICD-10-CM | POA: Insufficient documentation

## 2021-04-23 DIAGNOSIS — O10913 Unspecified pre-existing hypertension complicating pregnancy, third trimester: Secondary | ICD-10-CM

## 2021-04-23 DIAGNOSIS — E669 Obesity, unspecified: Secondary | ICD-10-CM

## 2021-04-23 DIAGNOSIS — O99213 Obesity complicating pregnancy, third trimester: Secondary | ICD-10-CM | POA: Insufficient documentation

## 2021-04-23 DIAGNOSIS — Z3A36 36 weeks gestation of pregnancy: Secondary | ICD-10-CM | POA: Diagnosis not present

## 2021-04-23 DIAGNOSIS — Z6841 Body Mass Index (BMI) 40.0 and over, adult: Secondary | ICD-10-CM

## 2021-04-23 DIAGNOSIS — O10013 Pre-existing essential hypertension complicating pregnancy, third trimester: Secondary | ICD-10-CM

## 2021-04-24 LAB — GC/CHLAMYDIA PROBE AMP (~~LOC~~) NOT AT ARMC
Chlamydia: NEGATIVE
Comment: NEGATIVE
Comment: NORMAL
Neisseria Gonorrhea: NEGATIVE

## 2021-04-26 LAB — CULTURE, BETA STREP (GROUP B ONLY): Strep Gp B Culture: POSITIVE — AB

## 2021-04-28 ENCOUNTER — Encounter: Payer: Self-pay | Admitting: Family Medicine

## 2021-04-28 DIAGNOSIS — O99820 Streptococcus B carrier state complicating pregnancy: Secondary | ICD-10-CM | POA: Insufficient documentation

## 2021-04-29 ENCOUNTER — Other Ambulatory Visit: Payer: Self-pay

## 2021-04-29 ENCOUNTER — Ambulatory Visit (INDEPENDENT_AMBULATORY_CARE_PROVIDER_SITE_OTHER): Payer: Self-pay | Admitting: Family Medicine

## 2021-04-29 VITALS — BP 119/88 | HR 102 | Wt 267.9 lb

## 2021-04-29 DIAGNOSIS — O99210 Obesity complicating pregnancy, unspecified trimester: Secondary | ICD-10-CM

## 2021-04-29 DIAGNOSIS — R768 Other specified abnormal immunological findings in serum: Secondary | ICD-10-CM

## 2021-04-29 DIAGNOSIS — O0990 Supervision of high risk pregnancy, unspecified, unspecified trimester: Secondary | ICD-10-CM

## 2021-04-29 DIAGNOSIS — Z789 Other specified health status: Secondary | ICD-10-CM

## 2021-04-29 DIAGNOSIS — O10919 Unspecified pre-existing hypertension complicating pregnancy, unspecified trimester: Secondary | ICD-10-CM

## 2021-04-29 DIAGNOSIS — O99820 Streptococcus B carrier state complicating pregnancy: Secondary | ICD-10-CM

## 2021-04-29 NOTE — Patient Instructions (Signed)
Eleccin del mtodo anticonceptivo Contraception Choices La anticoncepcin, o los mtodos anticonceptivos, hace referencia a los mtodos o dispositivos que evitan el embarazo. Mtodos hormonales Implante anticonceptivo Un implante anticonceptivo consiste en un tubo delgado de plstico que contiene una hormona que evita el embarazo. Es diferente de un dispositivo intrauterino (DIU). Un mdico lo inserta en la parte superior del brazo. Los implantes pueden ser eficaces durante un mximo de 3 aos. Inyecciones de progestina sola Las inyecciones de progestina sola contienen progestina, una forma sinttica de la hormona progesterona. Un mdico las administra cada 3 meses. Pldoras anticonceptivas Las pldoras anticonceptivas son pastillas que contienen hormonas que evitan el embarazo. Deben tomarse una vez al da, preferentemente a la misma hora cada da. Se necesita una receta para utilizar este mtodo anticonceptivo. Parche anticonceptivo El parche anticonceptivo contiene hormonas que evitan el embarazo. Se coloca en la piel, debe cambiarse una vez a la semana durante tres semanas y debe retirarse en la cuarta semana. Se necesita una receta para utilizar este mtodo anticonceptivo. Anillo vaginal Un anillo vaginal contiene hormonas que evitan el embarazo. Se coloca en la vagina durante tres semanas y se retira en la cuarta semana. Luego se repite el proceso con un anillo nuevo. Se necesita una receta para utilizar este mtodo anticonceptivo. Anticonceptivo de emergencia Los anticonceptivos de emergencia son mtodos para evitar un embarazo despus de tener sexo sin proteccin. Vienen en forma de pldora y pueden tomarse hasta 5 das despus de tener sexo. Funcionan mejor cuando se toman lo ms pronto posible luego de tener sexo. La mayora de los anticonceptivos de emergencia estn disponibles sin receta mdica. Este mtodo no debe utilizarse como el nico mtodo anticonceptivo. Mtodos de  barrera Condn masculino Un condn masculino es una vaina delgada que se coloca sobre el pene durante el sexo. Los condones evitan que el esperma ingrese en el cuerpo de la mujer. Pueden utilizarse con un una sustancia que mata a los espermatozoides (espermicida) para aumentar la efectividad. Deben desecharse despus de un uso. Condn femenino Un condn femenino es una vaina blanda y holgada que se coloca en la vagina antes de tener sexo. El condn evita que el esperma ingrese en el cuerpo de la mujer. Deben desecharse despus de un uso. Diafragma Un diafragma es una barrera blanda con forma de cpula. Se inserta en la vagina antes del sexo, junto con un espermicida. El diafragma bloquea el ingreso de esperma en el tero, y el espermicida mata a los espermatozoides. El diafragma debe permanecer en la vagina durante 6 a 8 horas despus de tener sexo y debe retirarse en el plazo de las 24 horas. Un diafragma es recetado y colocado por un mdico. Debe reemplazarse cada 1 a 2 aos, despus de dar a luz, de aumentar ms de 15lb (6.8kg) y de una ciruga plvica. Capuchn cervical Un capuchn cervical es una copa redonda y blanda de ltex o plstico que se coloca en el cuello uterino. Se inserta en la vagina antes del sexo, junto con un espermicida. Bloquea el ingreso del esperma en el tero. El capuchn debe permanecer en el lugar durante 6 a 8 horas despus de tener sexo y debe retirarse en el plazo de las 48 horas. Un capuchn cervical debe ser recetado y colocado por un mdico. Debe reemplazarse cada 2aos. Esponja Una esponja es una pieza blanda y circular de espuma de poliuretano que contiene espermicida. La esponja ayuda a bloquear el ingreso de esperma en el tero, y el espermicida mata a   los espermatozoides. Para utilizarla, debe humedecerla e insertarla en la vagina. Debe insertarse antes de tener sexo, debe permanecer dentro al menos durante 6 horas despus de tener sexo y debe retirarse y  desecharse en el plazo de las 30 horas. Espermicidas Los espermicidas son sustancias qumicas que matan o bloquean al esperma y no lo dejan ingresar al cuello uterino y al tero. Vienen en forma de crema, gel, supositorio, espuma o comprimido. Un espermicida debe insertarse en la vagina con un aplicador al menos 10 o 15 minutos antes de tener sexo para dar tiempo a que surta efecto. El proceso debe repetirse cada vez que tenga sexo. Los espermicidas no requieren receta mdica. Anticonceptivos intrauterinos Dispositivo intrauterino (DIU) Un DIU es un dispositivo en forma de T que se coloca en el tero. Existen dos tipos: DIU hormonal.Este tipo contiene progestina, una forma sinttica de la hormona progesterona. Este tipo puede permanecer colocado durante 3 a 5 aos. DIU de cobre.Este tipo est recubierto con un alambre de cobre. Puede permanecer colocado durante 10 aos. Mtodos anticonceptivos permanentes Ligadura de trompas en la mujer En este mtodo, se sellan, atan u obstruyen las trompas de Falopio durante una ciruga para evitar que el vulo descienda hacia el tero. Esterilizacin histeroscpica En este mtodo, se coloca un implante pequeo y flexible dentro de cada trompa de Falopio. Los implantes hacen que se forme un tejido cicatricial en las trompas de Falopio y que las obstruya para que el espermatozoide no pueda llegar al vulo. El procedimiento demora alrededor de 3 meses para que sea efectivo. Debe utilizarse otro mtodo anticonceptivo durante esos 3 meses. Esterilizacin masculina Este es un procedimiento que consiste en atar los conductos que transportan el esperma (vasectoma). Luego del procedimiento, el hombre puede eyacular lquido (semen). Debe utilizarse otro mtodo anticonceptivo durante 3 meses despus del procedimiento. Mtodos de planificacin natural Planificacin familiar natural En este mtodo, la pareja no tiene sexo durante los das en que la mujer podra quedar  embarazada. Mtodo calendario En este mtodo, la mujer realiza un seguimiento de la duracin de cada ciclo menstrual, identifica los das en los que se puede producir un embarazo y no tiene sexo durante esos das. Mtodo de la ovulacin En este mtodo, la pareja evita tener sexo durante la ovulacin. Mtodo sintotrmico Este mtodo implica no tener sexo durante la ovulacin. Normalmente, la mujer comprueba la ovulacin al observar cambios en su temperatura y en la consistencia del moco cervical. Mtodo posovulacin En este mtodo, la pareja espera a que finalice la ovulacin para tener sexo. Dnde buscar ms informacin Centers for Disease Control and Prevention (Centros para el Control y la Prevencin de Enfermedades): www.cdc.gov Resumen La anticoncepcin, o los mtodos anticonceptivos, hace referencia a los mtodos o dispositivos que evitan el embarazo. Los mtodos anticonceptivos hormonales incluyen implantes, inyecciones, pastillas, parches, anillos vaginales y anticonceptivos de emergencia. Los mtodos anticonceptivos de barrera pueden incluir condones masculinos, condones femeninos, diafragmas, capuchones cervicales, esponjas y espermicidas. Existen dos tipos de DIU (dispositivo intrauterino). Un DIU puede colocarse en el tero de una mujer para evitar el embarazo durante 3 a 5 aos. La esterilizacin permanente puede realizarse mediante un procedimiento tanto en los hombres como en las mujeres. Los mtodos de planificacin familiar natural implican no tener sexo durante los das en que la mujer podra quedar embarazada. Esta informacin no tiene como fin reemplazar el consejo del mdico. Asegrese de hacerle al mdico cualquier pregunta que tenga. Document Revised: 03/05/2020 Document Reviewed: 03/05/2020 Elsevier Patient Education  2022 Elsevier   Inc.  

## 2021-04-29 NOTE — Progress Notes (Signed)
   Subjective:  Joyce Barnes is a 19 y.o. G1P0000 at [redacted]w[redacted]d being seen today for ongoing prenatal care.  She is currently monitored for the following issues for this high-risk pregnancy and has Supervision of high risk pregnancy, antepartum; Chronic hypertension affecting pregnancy; Language barrier; BMI 40.0-44.9, adult (HCC); Obesity in pregnancy; Biological false positive RPR test; [redacted] weeks gestation of pregnancy; Headache in pregnancy, antepartum, second trimester; [redacted] weeks gestation of pregnancy; and GBS (group B Streptococcus carrier), +RV culture, currently pregnant on their problem list.  Patient reports no complaints.  Contractions: Irritability. Vag. Bleeding: Small.  Movement: (!) Decreased. Denies leaking of fluid.   The following portions of the patient's history were reviewed and updated as appropriate: allergies, current medications, past family history, past medical history, past social history, past surgical history and problem list. Problem list updated.  Objective:   Vitals:   04/29/21 1358 04/29/21 1414  BP: (!) 170/86 119/88  Pulse: (!) 102   Weight: 267 lb 14.4 oz (121.5 kg)     Fetal Status: Fetal Heart Rate (bpm): 125   Movement: (!) Decreased     General:  Alert, oriented and cooperative. Patient is in no acute distress.  Skin: Skin is warm and dry. No rash noted.   Cardiovascular: Normal heart rate noted  Respiratory: Normal respiratory effort, no problems with respiration noted  Abdomen: Soft, gravid, appropriate for gestational age. Pain/Pressure: Present     Pelvic: Vag. Bleeding: Small Vag D/C Character: White   Cervical exam deferred        Extremities: Normal range of motion.  Edema: Trace  Mental Status: Normal mood and affect. Normal behavior. Normal judgment and thought content.   Urinalysis:      Assessment and Plan:  Pregnancy: G1P0000 at [redacted]w[redacted]d  1. Supervision of high risk pregnancy, antepartum See below regarding BP FHR normal Reports  last night had DFM, but currently feels baby moving NST obtained, reactive  2. Chronic hypertension affecting pregnancy First BP severe range but both patient and interpreter noted she was moving and talking at that time Repeat normal and more c/w prior BP's this pregnancy Reports she is compliant with nifedipine and labetalol as prescribed Asymptomatic Last growth US unremarkable Weekly BPP's have been reassuring Scheduled today for IOL at 39 weeks (schedule reviewed prior to this and only one other faculty induction that day), orders placed  3. Obesity in pregnancy   4. Biological false positive RPR test Neg T.pall Abs  5. GBS (group B Streptococcus carrier), +RV culture, currently pregnant Penicillin in labor  6. Language barrier Spanish  Preterm labor symptoms and general obstetric precautions including but not limited to vaginal bleeding, contractions, leaking of fluid and fetal movement were reviewed in detail with the patient. Please refer to After Visit Summary for other counseling recommendations.  Return in 1 week (on 05/06/2021).   Venora Maples, MD

## 2021-04-30 ENCOUNTER — Ambulatory Visit: Payer: Medicaid Other | Admitting: *Deleted

## 2021-04-30 ENCOUNTER — Other Ambulatory Visit: Payer: Self-pay | Admitting: Advanced Practice Midwife

## 2021-04-30 ENCOUNTER — Ambulatory Visit: Payer: Medicaid Other | Attending: Obstetrics

## 2021-04-30 ENCOUNTER — Telehealth (HOSPITAL_COMMUNITY): Payer: Self-pay | Admitting: *Deleted

## 2021-04-30 ENCOUNTER — Encounter: Payer: Self-pay | Admitting: *Deleted

## 2021-04-30 VITALS — BP 131/77 | HR 90

## 2021-04-30 DIAGNOSIS — E669 Obesity, unspecified: Secondary | ICD-10-CM

## 2021-04-30 DIAGNOSIS — Z362 Encounter for other antenatal screening follow-up: Secondary | ICD-10-CM | POA: Diagnosis not present

## 2021-04-30 DIAGNOSIS — Z3A37 37 weeks gestation of pregnancy: Secondary | ICD-10-CM | POA: Diagnosis not present

## 2021-04-30 DIAGNOSIS — O10913 Unspecified pre-existing hypertension complicating pregnancy, third trimester: Secondary | ICD-10-CM

## 2021-04-30 DIAGNOSIS — O99213 Obesity complicating pregnancy, third trimester: Secondary | ICD-10-CM | POA: Diagnosis not present

## 2021-04-30 DIAGNOSIS — O10013 Pre-existing essential hypertension complicating pregnancy, third trimester: Secondary | ICD-10-CM

## 2021-04-30 DIAGNOSIS — Z6841 Body Mass Index (BMI) 40.0 and over, adult: Secondary | ICD-10-CM

## 2021-04-30 NOTE — Telephone Encounter (Signed)
Preadmission screen Interpreter number [phone]

## 2021-05-01 ENCOUNTER — Telehealth (HOSPITAL_COMMUNITY): Payer: Self-pay | Admitting: *Deleted

## 2021-05-01 NOTE — Telephone Encounter (Signed)
Preadmission screen  

## 2021-05-02 ENCOUNTER — Telehealth (HOSPITAL_COMMUNITY): Payer: Self-pay | Admitting: *Deleted

## 2021-05-02 ENCOUNTER — Encounter (HOSPITAL_COMMUNITY): Payer: Self-pay

## 2021-05-02 ENCOUNTER — Encounter (HOSPITAL_COMMUNITY): Payer: Self-pay | Admitting: *Deleted

## 2021-05-02 NOTE — Telephone Encounter (Signed)
Preadmission screen  

## 2021-05-02 NOTE — Telephone Encounter (Signed)
[id number] interpreter number

## 2021-05-04 ENCOUNTER — Inpatient Hospital Stay (HOSPITAL_COMMUNITY)
Admission: AD | Admit: 2021-05-04 | Discharge: 2021-05-04 | Disposition: A | Discharge status: Home / Self Care | Payer: Medicaid Other | Attending: Obstetrics & Gynecology | Admitting: Obstetrics & Gynecology

## 2021-05-04 ENCOUNTER — Other Ambulatory Visit: Payer: Self-pay

## 2021-05-04 DIAGNOSIS — Z3A38 38 weeks gestation of pregnancy: Secondary | ICD-10-CM | POA: Diagnosis not present

## 2021-05-04 DIAGNOSIS — Z79899 Other long term (current) drug therapy: Secondary | ICD-10-CM | POA: Insufficient documentation

## 2021-05-04 DIAGNOSIS — R519 Headache, unspecified: Secondary | ICD-10-CM | POA: Diagnosis not present

## 2021-05-04 DIAGNOSIS — O368130 Decreased fetal movements, third trimester, not applicable or unspecified: Secondary | ICD-10-CM

## 2021-05-04 DIAGNOSIS — O26893 Other specified pregnancy related conditions, third trimester: Secondary | ICD-10-CM | POA: Diagnosis not present

## 2021-05-04 DIAGNOSIS — O10919 Unspecified pre-existing hypertension complicating pregnancy, unspecified trimester: Secondary | ICD-10-CM

## 2021-05-04 DIAGNOSIS — O10913 Unspecified pre-existing hypertension complicating pregnancy, third trimester: Secondary | ICD-10-CM | POA: Diagnosis not present

## 2021-05-04 LAB — PROTEIN / CREATININE RATIO, URINE
Creatinine, Urine: 77.98 mg/dL
Protein Creatinine Ratio: 0.12 mg/mg{Cre} (ref 0.00–0.15)
Total Protein, Urine: 9 mg/dL

## 2021-05-04 LAB — URINALYSIS, ROUTINE W REFLEX MICROSCOPIC
Bilirubin Urine: NEGATIVE
Glucose, UA: NEGATIVE mg/dL
Hgb urine dipstick: NEGATIVE
Ketones, ur: NEGATIVE mg/dL
Leukocytes,Ua: NEGATIVE
Nitrite: NEGATIVE
Protein, ur: NEGATIVE mg/dL
Specific Gravity, Urine: 1.015 (ref 1.005–1.030)
pH: 6.5 (ref 5.0–8.0)

## 2021-05-04 LAB — CBC
HCT: 36.9 % (ref 36.0–46.0)
Hemoglobin: 12.3 g/dL (ref 12.0–15.0)
MCH: 28 pg (ref 26.0–34.0)
MCHC: 33.3 g/dL (ref 30.0–36.0)
MCV: 84.1 fL (ref 80.0–100.0)
Platelets: 301 10*3/uL (ref 150–400)
RBC: 4.39 MIL/uL (ref 3.87–5.11)
RDW: 13.5 % (ref 11.5–15.5)
WBC: 9.7 10*3/uL (ref 4.0–10.5)
nRBC: 0 % (ref 0.0–0.2)

## 2021-05-04 LAB — COMPREHENSIVE METABOLIC PANEL
ALT: 14 U/L (ref 0–44)
AST: 17 U/L (ref 15–41)
Albumin: 3 g/dL — ABNORMAL LOW (ref 3.5–5.0)
Alkaline Phosphatase: 133 U/L — ABNORMAL HIGH (ref 38–126)
Anion gap: 10 (ref 5–15)
BUN: <5 mg/dL — ABNORMAL LOW (ref 6–20)
CO2: 20 mmol/L — ABNORMAL LOW (ref 22–32)
Calcium: 9.3 mg/dL (ref 8.9–10.3)
Chloride: 105 mmol/L (ref 98–111)
Creatinine, Ser: 0.52 mg/dL (ref 0.44–1.00)
GFR, Estimated: >60 mL/min (ref >60)
Glucose, Bld: 84 mg/dL (ref 70–99)
Potassium: 3.9 mmol/L (ref 3.5–5.1)
Sodium: 135 mmol/L (ref 135–145)
Total Bilirubin: 0.4 mg/dL (ref 0.3–1.2)
Total Protein: 6.4 g/dL — ABNORMAL LOW (ref 6.5–8.1)

## 2021-05-04 MED ORDER — CYCLOBENZAPRINE HCL 5 MG PO TABS
10.0000 mg | ORAL_TABLET | Freq: Once | ORAL | Status: AC
  Administered 2021-05-04: 10 mg via ORAL
  Filled 2021-05-04: qty 2

## 2021-05-04 MED ORDER — METOCLOPRAMIDE HCL 10 MG PO TABS: 10.0000 mg | ORAL_TABLET | Freq: Three times a day (TID) | ORAL | 0 refills | Status: DC | PRN

## 2021-05-04 MED ORDER — ACETAMINOPHEN 500 MG PO TABS
1000.0000 mg | ORAL_TABLET | Freq: Once | ORAL | Status: AC
  Administered 2021-05-04: 1000 mg via ORAL
  Filled 2021-05-04: qty 2

## 2021-05-04 NOTE — MAU Note (Signed)
Joyce Barnes is a 20 y.o. at [redacted]w[redacted]d here in MAU reporting: having frequent headaches for the past week. Has been taking tylenol but it is not working. Last took 500mg  tylenol yesterday. Denies visual changes or RUQ pain. DFM.  Onset of complaint: ongoing  Pain score: 7/10  Vitals:   05/04/21 0949  BP: 140/78  Pulse: 79  Resp: 16  Temp: 98.4 F (36.9 C)  SpO2: 97%     FHT:137  Lab orders placed from triage: UA

## 2021-05-04 NOTE — Discharge Instructions (Signed)
For prevention of migraines in pregnancy: -Magnesium, 400mg by mouth, once daily -Vitamin B2, 400mg by mouth, once daily  For treatment of migraines in pregnancy: -take medication at the first sign of the pain of a headache, or the first sign of your aura -start with 1000mg Tylenol (do not exceed 4000mg of Tylenol in 24hrs), with or without Reglan 10mg -if no relief after 1-2hours, can take Flexeril 10mg -if headache is severe and not relieved by the above, may take Fioricet, 1 tablet, no more than 3 days per month -Fioricet should only be used as a rescue medication, when absolutely necessary -if the above regimen does not resolve your headache at all, please come to MAU for additional treatment -if you take Fioricet, please be aware that this has Tylenol in it and will contribute to the 4,000mg of Tylenol that you are allowed to take per day  

## 2021-05-04 NOTE — MAU Provider Note (Signed)
History     CSN: [id number]  Arrival date and time: 05/04/21 4742   Event Date/Time   First Provider Initiated Contact with Patient 05/04/21 1033      Chief Complaint  Patient presents with   Headache   Decreased Fetal Movement   HPI Joyce Barnes is a 20 y.o. G1P0000 at [redacted]w[redacted]d who presents with headache. Patient has chronic hypertension & is currently taking procardia & labetalol (last took meds this morning). Has had intermittent headache for the last week. Frontal headache that she rates 7/10 but has slightly improved since arrival to MAU. Took 500 mg of tylenol & 5 mg of flexeril on Wednesday with some relief but symptoms returned. No aggravating factors. Denies visual disturbance or epigastric pain. No contractions, LOF, or vaginal bleeding. Good fetal movement.   OB History     Gravida  1   Para  0   Term  0   Preterm  0   AB  0   Living  0      SAB  0   IAB  0   Ectopic  0   Multiple  0   Live Births  0           Past Medical History:  Diagnosis Date   Hypertension     Past Surgical History:  Procedure Laterality Date   APPENDECTOMY      Family History  Problem Relation Age of Onset   Healthy Mother    Diabetes Father    Cancer Maternal Aunt     Social History   Tobacco Use   Smoking status: Never   Smokeless tobacco: Never  Vaping Use   Vaping Use: Never used  Substance Use Topics   Alcohol use: Not Currently   Drug use: Never    Allergies: No Known Allergies  No medications prior to admission.    Review of Systems  Constitutional: Negative.   Gastrointestinal: Negative.   Genitourinary: Negative.   Neurological:  Positive for headaches.  Physical Exam   Blood pressure 136/68, pulse 86, temperature 98.4 F (36.9 C), temperature source Oral, resp. rate 18, height 5\' 6"  (1.676 m), weight 121.2 kg, last menstrual period 07/06/2020, SpO2 97 %.  Patient Vitals for the past 24 hrs:  BP Temp Temp src Pulse Resp SpO2  Height Weight  05/04/21 1316 136/68 -- -- 86 18 -- -- --  05/04/21 1144 (!) 130/57 -- -- 73 20 -- -- --  05/04/21 1059 131/74 -- -- 85 18 -- -- --  05/04/21 1012 139/77 -- -- 91 18 -- -- --  05/04/21 0949 140/78 98.4 F (36.9 C) Oral 79 16 97 % -- --  05/04/21 0945 -- -- -- -- -- -- 5\' 6"  (1.676 m) 121.2 kg    Physical Exam Vitals and nursing note reviewed.  Constitutional:      General: She is not in acute distress.    Appearance: She is well-developed.  HENT:     Head: Normocephalic and atraumatic.  Eyes:     General: No scleral icterus. Cardiovascular:     Rate and Rhythm: Normal rate and regular rhythm.     Heart sounds: Normal heart sounds.  Pulmonary:     Effort: Pulmonary effort is normal. No respiratory distress.     Breath sounds: Normal breath sounds. No wheezing.  Skin:    General: Skin is warm and dry.  Neurological:     Mental Status: She is alert.     Deep  Tendon Reflexes: Reflexes normal.  Psychiatric:        Mood and Affect: Mood normal.        Behavior: Behavior normal.   NST:  Baseline: 130 bpm, Variability: Good {> 6 bpm), Accelerations: Reactive, and Decelerations: Absent  MAU Course  Procedures Results for orders placed or performed during the hospital encounter of 05/04/21 (from the past 24 hour(s))  Urinalysis, Routine w reflex microscopic Urine, Clean Catch     Status: None   Collection Time: 05/04/21  9:45 AM  Result Value Ref Range   Color, Urine YELLOW YELLOW   APPearance CLEAR CLEAR   Specific Gravity, Urine 1.015 1.005 - 1.030   pH 6.5 5.0 - 8.0   Glucose, UA NEGATIVE NEGATIVE mg/dL   Hgb urine dipstick NEGATIVE NEGATIVE   Bilirubin Urine NEGATIVE NEGATIVE   Ketones, ur NEGATIVE NEGATIVE mg/dL   Protein, ur NEGATIVE NEGATIVE mg/dL   Nitrite NEGATIVE NEGATIVE   Leukocytes,Ua NEGATIVE NEGATIVE  Protein / creatinine ratio, urine     Status: None   Collection Time: 05/04/21 10:41 AM  Result Value Ref Range   Creatinine, Urine 77.98  mg/dL   Total Protein, Urine 9 mg/dL   Protein Creatinine Ratio 0.12 0.00 - 0.15 mg/mg[Cre]  CBC     Status: None   Collection Time: 05/04/21 11:14 AM  Result Value Ref Range   WBC 9.7 4.0 - 10.5 K/uL   RBC 4.39 3.87 - 5.11 MIL/uL   Hemoglobin 12.3 12.0 - 15.0 g/dL   HCT 55.7 32.2 - 02.5 %   MCV 84.1 80.0 - 100.0 fL   MCH 28.0 26.0 - 34.0 pg   MCHC 33.3 30.0 - 36.0 g/dL   RDW 42.7 06.2 - 37.6 %   Platelets 301 150 - 400 K/uL   nRBC 0.0 0.0 - 0.2 %  Comprehensive metabolic panel     Status: Abnormal   Collection Time: 05/04/21 11:14 AM  Result Value Ref Range   Sodium 135 135 - 145 mmol/L   Potassium 3.9 3.5 - 5.1 mmol/L   Chloride 105 98 - 111 mmol/L   CO2 20 (L) 22 - 32 mmol/L   Glucose, Bld 84 70 - 99 mg/dL   BUN <5 (L) 6 - 20 mg/dL   Creatinine, Ser 2.83 0.44 - 1.00 mg/dL   Calcium 9.3 8.9 - 15.1 mg/dL   Total Protein 6.4 (L) 6.5 - 8.1 g/dL   Albumin 3.0 (L) 3.5 - 5.0 g/dL   AST 17 15 - 41 U/L   ALT 14 0 - 44 U/L   Alkaline Phosphatase 133 (H) 38 - 126 U/L   Total Bilirubin 0.4 0.3 - 1.2 mg/dL   GFR, Estimated >76 >16 mL/min   Anion gap 10 5 - 15    MDM Patient presents with headache. Has chronic hypertension. BPs stable in MAU & preeclampsia labs normal. Headache treated with tylenol & flexeril - patient reports resolution of symptoms. Reviewed treatment at home with proper dosing of meds & reasons to return to MAU  Assessment and Plan   1. Pregnancy headache in third trimester   2. Chronic hypertension affecting pregnancy   3. [redacted] weeks gestation of pregnancy    -rx reglan -take reglan & tylenol for headaches, add flexeril as needed -preeclampsia precautions  Judeth Horn 05/04/2021, 4:57 PM

## 2021-05-05 ENCOUNTER — Ambulatory Visit (INDEPENDENT_AMBULATORY_CARE_PROVIDER_SITE_OTHER): Payer: Self-pay | Admitting: Family Medicine

## 2021-05-05 VITALS — BP 133/85 | HR 95 | Wt 265.5 lb

## 2021-05-05 DIAGNOSIS — O0990 Supervision of high risk pregnancy, unspecified, unspecified trimester: Secondary | ICD-10-CM

## 2021-05-05 DIAGNOSIS — O99820 Streptococcus B carrier state complicating pregnancy: Secondary | ICD-10-CM

## 2021-05-05 DIAGNOSIS — Z789 Other specified health status: Secondary | ICD-10-CM

## 2021-05-05 DIAGNOSIS — O10919 Unspecified pre-existing hypertension complicating pregnancy, unspecified trimester: Secondary | ICD-10-CM

## 2021-05-05 NOTE — Progress Notes (Signed)
   PRENATAL VISIT NOTE  Subjective:  Joyce Barnes is a 20 y.o. G1P0000 at [redacted]w[redacted]d being seen today for ongoing prenatal care.  She is currently monitored for the following issues for this low-risk pregnancy and has Supervision of high risk pregnancy, antepartum; Chronic hypertension affecting pregnancy; Language barrier; BMI 40.0-44.9, adult (HCC); Obesity in pregnancy; Biological false positive RPR test; Headache in pregnancy, antepartum, second trimester; and GBS (group B Streptococcus carrier), +RV culture, currently pregnant on their problem list.  Patient reports no complaints.  Contractions: Irritability. Vag. Bleeding: None.  Movement: Present. Denies leaking of fluid.   The following portions of the patient's history were reviewed and updated as appropriate: allergies, current medications, past family history, past medical history, past social history, past surgical history and problem list.   Objective:   Vitals:   05/05/21 1343  BP: 133/85  Pulse: 95  Weight: 265 lb 8 oz (120.4 kg)    Fetal Status: Fetal Heart Rate (bpm): 147 Fundal Height: 37 cm Movement: Present  Presentation: Vertex  General:  Alert, oriented and cooperative. Patient is in no acute distress.  Skin: Skin is warm and dry. No rash noted.   Cardiovascular: Normal heart rate noted  Respiratory: Normal respiratory effort, no problems with respiration noted  Abdomen: Soft, gravid, appropriate for gestational age.  Pain/Pressure: Present     Pelvic: Cervical exam performed in the presence of a chaperone Dilation: 1.5 Effacement (%): 70 Station: -2, -1  Extremities: Normal range of motion.  Edema: Trace  Mental Status: Normal mood and affect. Normal behavior. Normal judgment and thought content.   Assessment and Plan:  Pregnancy: G1P0000 at [redacted]w[redacted]d 1. Chronic hypertension affecting pregnancy On Labetalol and Nifedipine and ASA--BP is ok today-- Last growth at 65% Antenatal testing with MFM--scheduled for  9/24--IOL scheduled at 39 wks.  2. Supervision of high risk pregnancy, antepartum GBS +  3. Language barrier Spanish interpreter: Video used   4. GBS (group B Streptococcus carrier), +RV culture, currently pregnant Will need treatment in labor  Term labor symptoms and general obstetric precautions including but not limited to vaginal bleeding, contractions, leaking of fluid and fetal movement were reviewed in detail with the patient. Please refer to After Visit Summary for other counseling recommendations.   Return in 1 week (on 05/12/2021).  Future Appointments  Date Time Provider Department Center  05/08/2021  1:45 PM [facility] NURSE [facility]  05/08/2021  2:00 PM [facility] US1 [facility]  05/10/2021  7:45 AM [facility] SCHED ROOM [facility] None    Reva Bores, MD

## 2021-05-05 NOTE — Patient Instructions (Signed)
Tercer trimestre de embarazo Third Trimester of Pregnancy El tercer trimestre de embarazo va desde la semana 28 hasta la semana 40. Esto corresponde a los meses 7 a 9. El tercer trimestre es un perodo en el que el beb en gestacin (feto) crece rpidamente. Hacia el final del noveno mes, el feto mide alrededor de 20pulgadas (45cm) de largo y pesa entre 6 y 10 libras (2.7 y 4.5kg). Cambios en el cuerpo durante el tercer trimestre Durante el tercer trimestre, su cuerpo contina experimentando numerosos cambios. Los cambios varan y generalmente vuelven a la normalidad despus del nacimiento del beb. Cambios fsicos Seguir aumentando de peso. Es de esperar que aumente entre 25 y 35libras (11 y 16kg) hacia el final del embarazo si inicia el embarazo con un peso normal. Si tiene bajo peso, es de esperar que aumente entre 28 y 40 libras (13 y18 kg), y si tiene sobrepeso, es de esperar que aumente entre 15 y 25 libras (7 y 11kg). Podrn aparecer las primeras estras en las caderas, el abdomen y las mamas. Las mamas seguirn creciendo y pueden doler. Un lquido amarillo (calostro) puede salir de sus pechos. Esta es la primera leche que usted produce para su beb. Tal vez haya cambios en el cabello. Esto cambios pueden incluir su engrosamiento, crecimiento rpido y cambios en la textura. A algunas personas tambin se les cae el cabello durante o despus del embarazo, o tienen el cabello seco o fino. El ombligo puede salir hacia afuera. Puede observar que se le hinchan las manos, el rostro o los tobillos. Cambios en la salud Es posible que tenga acidez estomacal. Puede sufrir estreimiento. Puede desarrollar hemorroides. Puede desarrollar venas hinchadas y abultadas (venas varicosas) en las piernas. Puede presentar ms dolor en la pelvis, la espalda o los muslos. Esto se debe al aumento de peso y al aumento de las hormonas que relajan las articulaciones. Puede presentar un aumento del hormigueo o  entumecimiento en las manos, brazos y piernas. La piel de su abdomen tambin puede sentirse entumecida. Puede sentir que le falta el aire debido a que se expande el tero. Otros cambios Puede tener necesidad de orinar con ms frecuencia porque el feto baja hacia la pelvis y ejerce presin sobre la vejiga. Puede tener ms problemas para dormir. Esto puede deberse al tamao de su abdomen, una mayor necesidad de orinar y un aumento en el metabolismo de su cuerpo. Puede notar que el feto "baja" o lo siente ms bajo, en el abdomen (aligeramiento). Puede tener un aumento de la secrecin vaginal. Puede notar que tiene dolor alrededor del hueso plvico a medida que el tero se distiende. Siga estas instrucciones en su casa: Medicamentos Siga las instrucciones del mdico en relacin con el uso de medicamentos. Durante el embarazo, hay medicamentos que pueden tomarse y otros que no. No tome ningn medicamento a menos que lo haya autorizado el mdico. Tome vitaminas prenatales que contengan por lo menos 600microgramos (mcg) de cido flico. Comida y bebida Lleve una dieta saludable que incluya frutas y verduras frescas, cereales integrales, buenas fuentes de protenas como carnes magras, huevos o tofu, y productos lcteos descremados. Evite la carne cruda y el jugo, la leche y el queso sin pasteurizar. Estos portan grmenes que pueden provocar dao tanto a usted como al beb. Tome 4 o 5 comidas pequeas en lugar de 3 comidas abundantes al da. Es posible que tenga que tomar estas medidas para prevenir o tratar el estreimiento: Beber suficiente lquido como para mantener la orina   de color amarillo plido. Consumir alimentos ricos en fibra, como frijoles, cereales integrales, y frutas y verduras frescas. Limitar el consumo de alimentos ricos en grasa y azcares procesados, como los alimentos fritos o dulces. Actividad Haga ejercicio solamente como se lo haya indicado el mdico. La mayora de las personas  pueden continuar su actividad fsica habitual durante el embarazo. Intente realizar como mnimo 30minutos de actividad fsica por lo menos 5das a la semana. Deje de hacer ejercicio si experimenta contracciones en el tero. Deje de hacer ejercicio si le aparecen dolor o clicos en la parte baja del vientre o de la espalda. Evite levantar pesos excesivos. No haga ejercicio si hace mucho calor o humedad, o si se encuentra a una altitud elevada. Si lo desea, puede seguir teniendo relaciones sexuales, salvo que el mdico le indique lo contrario. Alivio del dolor y del malestar Haga pausas frecuentes y descanse con las piernas levantadas (elevadas) si tiene calambres en las piernas o dolor en la parte baja de la espalda. Dese baos de asiento con agua tibia para aliviar el dolor o las molestias causadas por las hemorroides. Use una crema para las hemorroides si el mdico la autoriza. Use un sujetador que le brinde buen soporte para prevenir las molestias causadas por la sensibilidad en las mamas. Si tiene venas varicosas: Use medias de compresin como se lo haya indicado el mdico. Eleve los pies durante 15minutos, 3 o 4veces por da. Limite el consumo de sal en su dieta. Seguridad Hable con su mdico antes de viajar distancias largas. No se d baos de inmersin en agua caliente, baos turcos ni saunas. Use el cinturn de seguridad en todo momento mientras conduce o va en auto. Hable con el mdico si es vctima de maltrato verbal o fsico. Preparacin para el nacimiento Para prepararse para la llegada de su beb: Tome clases prenatales para entender, practicar, y hacer preguntas sobre el trabajo de parto y el parto. Visite el hospital y recorra el rea de maternidad. Compre un asiento de seguridad orientado hacia atrs, y asegrese de saber cmo instalarlo en su automvil. Prepare la habitacin o el lugar donde dormir el beb. Asegrese de quitar todas las almohadas y animales de peluche de  la cuna del beb para evitar la asfixia. Indicaciones generales Evite el contacto con las bandejas sanitarias de los gatos y la tierra que estos animales usan. Estos alimentos contienen bacterias que pueden causar defectos congnitos en el beb. Si tiene un gato, pdale a alguien que limpie la caja de arena por usted. No se haga lavados vaginales ni use tampones. No use toallas higinicas perfumadas. No consuma ningn producto que contenga nicotina o tabaco, como cigarrillos, cigarrillos electrnicos y tabaco de mascar. Si necesita ayuda para dejar de consumir estos productos, consulte al mdico. No use ningn remedio a base de hierbas, drogas ilegales o medicamentos que no le hayan sido recetados. Las sustancias qumicas de estos productos pueden daar al beb. No beba alcohol. Le realizarn exmenes prenatales ms frecuentes durante el tercer trimestre. Durante una visita prenatal de rutina, el mdico le har un examen fsico, le realizar pruebas y hablar con usted de su salud general. Cumpla con todas las visitas de seguimiento. Esto es importante. Dnde buscar ms informacin American Pregnancy Association (Asociacin Estadounidense del Embarazo): americanpregnancy.org American College of Obstetricians and Gynecologists (Colegio Estadounidense de Obstetras y Gineclogos): acog.org/womens-health/pregnancy? Office on Women's Health (Oficina para la Salud de la Mujer): womenshealth.gov/pregnancy Comunquese con un mdico si tiene: Fiebre. Clicos leves en la   pelvis, presin en la pelvis o dolor persistente en la zona abdominal o la parte baja de la espalda. Vmitos o diarrea. Secrecin vaginal con mal olor u orina con mal olor. Dolor al orinar. Un dolor de cabeza que no desaparece despus de tomar analgsicos. Cambios en la visin o ve manchas delante de los ojos. Solicite ayuda de inmediato si: Rompe la bolsa. Tiene contracciones regulares separadas por menos de 5minutos. Tiene sangrado o  pequeas prdidas vaginales. Siente un dolor abdominal intenso. Tiene dificultad para respirar. Siente dolor en el pecho. Sufre episodios de desmayo. No ha sentido a su beb moverse durante el perodo de tiempo que le indic el mdico. Tiene dolor, hinchazn o enrojecimiento nuevos en un brazo o una pierna o se produce un aumento de alguno de estos sntomas. Resumen El tercer trimestre del embarazo comprende desde la semana28 hasta la semana 40 (desde el mes7 hasta el mes9). Puede tener ms problemas para dormir. Esto puede deberse al tamao de su abdomen, una mayor necesidad de orinar y un aumento en el metabolismo de su cuerpo. Le realizarn exmenes prenatales ms frecuentes durante el tercer trimestre. Cumpla con todas las visitas de seguimiento. Esto es importante. Esta informacin no tiene como fin reemplazar el consejo del mdico. Asegrese de hacerle al mdico cualquier pregunta que tenga. Document Revised: 02/09/2020 Document Reviewed: 02/09/2020 Elsevier Patient Education  2022 [employer] Inc.  

## 2021-05-08 ENCOUNTER — Encounter: Payer: Self-pay | Admitting: General Practice

## 2021-05-08 ENCOUNTER — Ambulatory Visit: Payer: Medicaid Other | Attending: Maternal & Fetal Medicine

## 2021-05-08 ENCOUNTER — Encounter: Payer: Self-pay | Admitting: *Deleted

## 2021-05-08 ENCOUNTER — Other Ambulatory Visit: Payer: Self-pay | Admitting: Obstetrics and Gynecology

## 2021-05-08 ENCOUNTER — Ambulatory Visit: Payer: Medicaid Other | Admitting: *Deleted

## 2021-05-08 ENCOUNTER — Other Ambulatory Visit: Payer: Self-pay

## 2021-05-08 VITALS — BP 138/74 | HR 82

## 2021-05-08 DIAGNOSIS — Z6841 Body Mass Index (BMI) 40.0 and over, adult: Secondary | ICD-10-CM | POA: Diagnosis present

## 2021-05-08 DIAGNOSIS — O10013 Pre-existing essential hypertension complicating pregnancy, third trimester: Secondary | ICD-10-CM

## 2021-05-08 DIAGNOSIS — O10913 Unspecified pre-existing hypertension complicating pregnancy, third trimester: Secondary | ICD-10-CM | POA: Diagnosis not present

## 2021-05-08 DIAGNOSIS — E669 Obesity, unspecified: Secondary | ICD-10-CM

## 2021-05-08 DIAGNOSIS — I10 Essential (primary) hypertension: Secondary | ICD-10-CM | POA: Diagnosis present

## 2021-05-08 DIAGNOSIS — O99213 Obesity complicating pregnancy, third trimester: Secondary | ICD-10-CM

## 2021-05-08 DIAGNOSIS — Z3A38 38 weeks gestation of pregnancy: Secondary | ICD-10-CM

## 2021-05-08 LAB — SARS CORONAVIRUS 2 (TAT 6-24 HRS): SARS Coronavirus 2: NEGATIVE

## 2021-05-10 ENCOUNTER — Inpatient Hospital Stay (HOSPITAL_COMMUNITY)
Admission: AD | Admit: 2021-05-10 | Discharge: 2021-05-14 | DRG: 787 | Disposition: A | Discharge status: Home / Self Care | Payer: Medicaid Other | Attending: Obstetrics and Gynecology | Admitting: Obstetrics and Gynecology

## 2021-05-10 ENCOUNTER — Inpatient Hospital Stay (HOSPITAL_COMMUNITY): Payer: Medicaid Other | Admitting: Anesthesiology

## 2021-05-10 ENCOUNTER — Inpatient Hospital Stay (HOSPITAL_COMMUNITY): Payer: Medicaid Other

## 2021-05-10 ENCOUNTER — Encounter (HOSPITAL_COMMUNITY): Payer: Self-pay | Admitting: Family Medicine

## 2021-05-10 DIAGNOSIS — O99214 Obesity complicating childbirth: Secondary | ICD-10-CM | POA: Diagnosis present

## 2021-05-10 DIAGNOSIS — O99824 Streptococcus B carrier state complicating childbirth: Secondary | ICD-10-CM | POA: Diagnosis present

## 2021-05-10 DIAGNOSIS — Z3A39 39 weeks gestation of pregnancy: Secondary | ICD-10-CM

## 2021-05-10 DIAGNOSIS — O1092 Unspecified pre-existing hypertension complicating childbirth: Secondary | ICD-10-CM | POA: Diagnosis not present

## 2021-05-10 DIAGNOSIS — D62 Acute posthemorrhagic anemia: Secondary | ICD-10-CM | POA: Diagnosis not present

## 2021-05-10 DIAGNOSIS — O1002 Pre-existing essential hypertension complicating childbirth: Principal | ICD-10-CM | POA: Diagnosis present

## 2021-05-10 DIAGNOSIS — O9081 Anemia of the puerperium: Secondary | ICD-10-CM | POA: Diagnosis not present

## 2021-05-10 DIAGNOSIS — O10919 Unspecified pre-existing hypertension complicating pregnancy, unspecified trimester: Secondary | ICD-10-CM | POA: Diagnosis present

## 2021-05-10 LAB — CBC
HCT: 36 % (ref 36.0–46.0)
HCT: 37.3 % (ref 36.0–46.0)
Hemoglobin: 12.1 g/dL (ref 12.0–15.0)
Hemoglobin: 12.7 g/dL (ref 12.0–15.0)
MCH: 28.1 pg (ref 26.0–34.0)
MCH: 28.3 pg (ref 26.0–34.0)
MCHC: 33.6 g/dL (ref 30.0–36.0)
MCHC: 34 g/dL (ref 30.0–36.0)
MCV: 83.7 fL (ref 80.0–100.0)
MCV: 83.3 fL (ref 80.0–100.0)
Platelets: 293 10*3/uL (ref 150–400)
Platelets: 310 10*3/uL (ref 150–400)
RBC: 4.3 MIL/uL (ref 3.87–5.11)
RBC: 4.48 MIL/uL (ref 3.87–5.11)
RDW: 13.6 % (ref 11.5–15.5)
RDW: 13.8 % (ref 11.5–15.5)
WBC: 10.4 10*3/uL (ref 4.0–10.5)
WBC: 15.5 10*3/uL — ABNORMAL HIGH (ref 4.0–10.5)
nRBC: 0 % (ref 0.0–0.2)
nRBC: 0 % (ref 0.0–0.2)

## 2021-05-10 LAB — COMPREHENSIVE METABOLIC PANEL
ALT: 11 U/L (ref 0–44)
AST: 28 U/L (ref 15–41)
Albumin: 2.9 g/dL — ABNORMAL LOW (ref 3.5–5.0)
Alkaline Phosphatase: 147 U/L — ABNORMAL HIGH (ref 38–126)
Anion gap: 9 (ref 5–15)
BUN: <5 mg/dL — ABNORMAL LOW (ref 6–20)
CO2: 19 mmol/L — ABNORMAL LOW (ref 22–32)
Calcium: 9.2 mg/dL (ref 8.9–10.3)
Chloride: 107 mmol/L (ref 98–111)
Creatinine, Ser: 0.51 mg/dL (ref 0.44–1.00)
GFR, Estimated: >60 mL/min (ref >60)
Glucose, Bld: 75 mg/dL (ref 70–99)
Potassium: 4.4 mmol/L (ref 3.5–5.1)
Sodium: 135 mmol/L (ref 135–145)
Total Bilirubin: 0.8 mg/dL (ref 0.3–1.2)
Total Protein: 6.2 g/dL — ABNORMAL LOW (ref 6.5–8.1)

## 2021-05-10 LAB — RPR: RPR Ser Ql: NONREACTIVE

## 2021-05-10 LAB — PROTEIN / CREATININE RATIO, URINE
Creatinine, Urine: 53.22 mg/dL
Protein Creatinine Ratio: 0.13 mg/mg{Cre} (ref 0.00–0.15)
Total Protein, Urine: 7 mg/dL

## 2021-05-10 LAB — TYPE AND SCREEN
ABO/RH(D): O POS
Antibody Screen: NEGATIVE

## 2021-05-10 MED ORDER — OXYTOCIN-SODIUM CHLORIDE 30-0.9 UT/500ML-% IV SOLN
2.5000 [IU]/h | INTRAVENOUS | Status: DC
  Filled 2021-05-10: qty 500

## 2021-05-10 MED ORDER — EPHEDRINE 5 MG/ML INJ: 10.0000 mg | INTRAVENOUS | Status: DC | PRN

## 2021-05-10 MED ORDER — NIFEDIPINE ER OSMOTIC RELEASE 30 MG PO TB24
60.0000 mg | ORAL_TABLET | Freq: Every day | ORAL | Status: DC
  Administered 2021-05-10 – 2021-05-11 (×2): 60 mg via ORAL
  Filled 2021-05-10: qty 2
  Filled 2021-05-10 (×2): qty 1

## 2021-05-10 MED ORDER — LACTATED RINGERS IV SOLN: INTRAVENOUS | Status: DC

## 2021-05-10 MED ORDER — OXYTOCIN-SODIUM CHLORIDE 30-0.9 UT/500ML-% IV SOLN
1.0000 m[IU]/min | INTRAVENOUS | Status: DC
  Administered 2021-05-10: 2 m[IU]/min via INTRAVENOUS
  Administered 2021-05-11: 16 m[IU]/min via INTRAVENOUS
  Filled 2021-05-10: qty 500

## 2021-05-10 MED ORDER — ACETAMINOPHEN 325 MG PO TABS
650.0000 mg | ORAL_TABLET | ORAL | Status: DC | PRN
  Administered 2021-05-10: 650 mg via ORAL
  Filled 2021-05-10: qty 2

## 2021-05-10 MED ORDER — PHENYLEPHRINE 40 MCG/ML (10ML) SYRINGE FOR IV PUSH (FOR BLOOD PRESSURE SUPPORT)
80.0000 ug | PREFILLED_SYRINGE | INTRAVENOUS | Status: DC | PRN
  Filled 2021-05-10 (×2): qty 10

## 2021-05-10 MED ORDER — SOD CITRATE-CITRIC ACID 500-334 MG/5ML PO SOLN
30.0000 mL | ORAL | Status: DC | PRN
  Administered 2021-05-11: 30 mL via ORAL
  Filled 2021-05-10: qty 30

## 2021-05-10 MED ORDER — LIDOCAINE HCL (PF) 1 % IJ SOLN: 30.0000 mL | INTRAMUSCULAR | Status: DC | PRN

## 2021-05-10 MED ORDER — TERBUTALINE SULFATE 1 MG/ML IJ SOLN: 0.2500 mg | Freq: Once | INTRAMUSCULAR | Status: DC | PRN

## 2021-05-10 MED ORDER — LACTATED RINGERS IV SOLN
500.0000 mL | INTRAVENOUS | Status: DC | PRN
  Administered 2021-05-10: 1000 mL via INTRAVENOUS
  Administered 2021-05-11: 500 mL via INTRAVENOUS

## 2021-05-10 MED ORDER — SODIUM CHLORIDE 0.9 % IV SOLN
5.0000 10*6.[IU] | Freq: Once | INTRAVENOUS | Status: AC
  Administered 2021-05-10: 5 10*6.[IU] via INTRAVENOUS
  Filled 2021-05-10: qty 5

## 2021-05-10 MED ORDER — LACTATED RINGERS IV SOLN
500.0000 mL | Freq: Once | INTRAVENOUS | Status: AC
  Administered 2021-05-11: 500 mL via INTRAVENOUS

## 2021-05-10 MED ORDER — PENICILLIN G POT IN DEXTROSE 60000 UNIT/ML IV SOLN
3.0000 10*6.[IU] | INTRAVENOUS | Status: DC
  Administered 2021-05-10 – 2021-05-11 (×7): 3 10*6.[IU] via INTRAVENOUS
  Filled 2021-05-10 (×7): qty 50

## 2021-05-10 MED ORDER — LABETALOL HCL 100 MG PO TABS
300.0000 mg | ORAL_TABLET | Freq: Three times a day (TID) | ORAL | Status: DC
  Administered 2021-05-10 – 2021-05-11 (×4): 300 mg via ORAL
  Filled 2021-05-10 (×4): qty 3

## 2021-05-10 MED ORDER — ONDANSETRON HCL 4 MG/2ML IJ SOLN
4.0000 mg | Freq: Four times a day (QID) | INTRAMUSCULAR | Status: DC | PRN
  Administered 2021-05-11: 4 mg via INTRAVENOUS
  Filled 2021-05-10: qty 2

## 2021-05-10 MED ORDER — FENTANYL CITRATE (PF) 100 MCG/2ML IJ SOLN
50.0000 ug | INTRAMUSCULAR | Status: DC | PRN
  Administered 2021-05-10: 100 ug via INTRAVENOUS
  Administered 2021-05-10: 50 ug via INTRAVENOUS
  Filled 2021-05-10 (×3): qty 2

## 2021-05-10 MED ORDER — LIDOCAINE HCL (PF) 1 % IJ SOLN
INTRAMUSCULAR | Status: DC | PRN
  Administered 2021-05-10 (×2): 4 mL via EPIDURAL

## 2021-05-10 MED ORDER — LABETALOL HCL 200 MG PO TABS
300.0000 mg | ORAL_TABLET | Freq: Three times a day (TID) | ORAL | Status: DC
  Administered 2021-05-10: 300 mg via ORAL
  Filled 2021-05-10: qty 1

## 2021-05-10 MED ORDER — DIPHENHYDRAMINE HCL 50 MG/ML IJ SOLN
12.5000 mg | INTRAMUSCULAR | Status: AC | PRN
  Administered 2021-05-11 (×3): 12.5 mg via INTRAVENOUS
  Filled 2021-05-10 (×2): qty 1

## 2021-05-10 MED ORDER — OXYTOCIN BOLUS FROM INFUSION: 333.0000 mL | Freq: Once | INTRAVENOUS | Status: DC

## 2021-05-10 MED ORDER — FENTANYL-BUPIVACAINE-NACL 0.5-0.125-0.9 MG/250ML-% EP SOLN
12.0000 mL/h | EPIDURAL | Status: DC | PRN
  Administered 2021-05-10: 12 mL/h via EPIDURAL
  Filled 2021-05-10 (×2): qty 250

## 2021-05-10 MED ORDER — PHENYLEPHRINE 40 MCG/ML (10ML) SYRINGE FOR IV PUSH (FOR BLOOD PRESSURE SUPPORT): 80.0000 ug | PREFILLED_SYRINGE | INTRAVENOUS | Status: DC | PRN

## 2021-05-10 MED ORDER — MISOPROSTOL 50MCG HALF TABLET
50.0000 ug | ORAL_TABLET | ORAL | Status: DC
  Administered 2021-05-10 (×2): 50 ug via BUCCAL
  Filled 2021-05-10: qty 1

## 2021-05-10 NOTE — Progress Notes (Signed)
Patient ID: Joyce Barnes, female   DOB: 06-27-2001, 20 y.o.   MRN: [id number] Doing well Wants to wait a bit for her epidural  Vitals:   05/10/21 1750 05/10/21 1920 05/10/21 2141 05/10/21 2237  BP:  135/76 132/64 127/60  Pulse:  86 91 92  Resp:   17   Temp: 99.2 F (37.3 C)  98.4 F (36.9 C)   TempSrc: Oral  Oral   Weight:      Height:       FHR reactive UCs every 2 min  Cervical exam deferred  Will continue to observe

## 2021-05-10 NOTE — Progress Notes (Signed)
LABOR PROGRESS NOTE  Joyce Barnes is a 20 y.o. G1P0000 at [redacted]w[redacted]d  admitted for IOL for cHTN  Subjective: Doing well, reports she is starting to feel occasional contractions.  Objective: BP (!) 146/84   Pulse 94   Temp 99.4 F (37.4 C) (Oral)   Ht 5\' 6"  (1.676 m)   Wt 122.2 kg   LMP 07/06/2020 (Within Weeks)   BMI 43.47 kg/m  or  Vitals:   05/10/21 0841 05/10/21 1033 05/10/21 1120 05/10/21 1444  BP:  (!) 151/85 (!) 146/84   Pulse:  88 94   Temp:    99.4 F (37.4 C)  TempSrc:    Oral  Weight:      Height: 5\' 6"  (1.676 m)      Dilation: 1 Effacement (%): 50 Station: -2 Presentation: Vertex Exam by:: Dr. FHT: baseline rate 130, moderate varibility, + acel, no decel Toco: irregular   Labs: Lab Results  Component Value Date   WBC 10.4 05/10/2021   HGB 12.1 05/10/2021   HCT 36.0 05/10/2021   MCV 83.7 05/10/2021   PLT 293 05/10/2021    Patient Active Problem List   Diagnosis Date Noted   GBS (group B Streptococcus carrier), +RV culture, currently pregnant 04/28/2021   Headache in pregnancy, antepartum, second trimester 02/03/2021   Language barrier 11/21/2020   BMI 40.0-44.9, adult (HCC) 11/21/2020   Obesity in pregnancy 11/21/2020   Biological false positive RPR test 11/21/2020   Supervision of high risk pregnancy, antepartum 09/18/2020   Chronic hypertension affecting pregnancy 09/18/2020    Assessment / Plan: 20 y.o. G1P0000 at [redacted]w[redacted]d here for IOL for cHTN   Labor: progressing well. S/p cytotec x1. Foley bulb placed on this exam. Will give additional cytotec at this time and consider starting pitocin at next check.  Fetal Wellbeing:  Cat 1, reassuring  Pain Control:  IV pain meds and PRN epidural  Anticipated MOD:  vaginal  cHTN: BP 144/83-151/85 with most recent being 146/84. On labetalol 300 mg TID and nifedapine. P/C ratio pending.   36, MD  PGY-3, [facility]  05/10/2021, 3:07 PM

## 2021-05-10 NOTE — Progress Notes (Signed)
LABOR PROGRESS NOTE  Joyce Barnes is a 20 y.o. G1P0000 at [redacted]w[redacted]d  admitted for IOL for cHTN  Subjective: Laying down, extremely uncomfortable from the foley balloon.   Objective: BP (!) 155/88   Pulse 94   Temp 99.2 F (37.3 C) (Oral)   Ht 5\' 6"  (1.676 m)   Wt 122.2 kg   LMP 07/06/2020 (Within Weeks)   BMI 43.47 kg/m  or  Vitals:   05/10/21 1120 05/10/21 1444 05/10/21 1747 05/10/21 1750  BP: (!) 146/84  (!) 155/88   Pulse: 94  94   Temp:  99.4 F (37.4 C)  99.2 F (37.3 C)  TempSrc:  Oral  Oral  Weight:      Height:       Dilation: 6 Effacement (%): 80 Station: -2 Presentation: Vertex Exam by:: Dr. [email or web address] FHT: baseline rate 140, moderate varibility, + acel, no decel Toco: 2-4 min   Labs: Lab Results  Component Value Date   WBC 10.4 05/10/2021   HGB 12.1 05/10/2021   HCT 36.0 05/10/2021   MCV 83.7 05/10/2021   PLT 293 05/10/2021    Patient Active Problem List   Diagnosis Date Noted   GBS (group B Streptococcus carrier), +RV culture, currently pregnant 04/28/2021   Headache in pregnancy, antepartum, second trimester 02/03/2021   Language barrier 11/21/2020   BMI 40.0-44.9, adult (HCC) 11/21/2020   Obesity in pregnancy 11/21/2020   Biological false positive RPR test 11/21/2020   Supervision of high risk pregnancy, antepartum 09/18/2020   Chronic hypertension affecting pregnancy 09/18/2020    Assessment / Plan: 20 y.o. G1P0000 at [redacted]w[redacted]d here for IOL for cHTN   Labor: progressing well. S/p cytotec x2. FB out @ 1835. Initiating pitocin.  Fetal Wellbeing:  Cat 1, reassuring  Pain Control:  Patient is currently requesting epidural  Anticipated MOD:  vaginal  cHTN: BP 144/83-155/88. On labetalol 300 mg TID and nifedapine. Reports previous mild headache which has resolved. P/C ratio pending.   [redacted]w[redacted]d, MD  PGY-3, [facility]  05/10/2021, 6:40 PM

## 2021-05-10 NOTE — Anesthesia Procedure Notes (Signed)
Epidural Patient location during procedure: OB Start time: 05/10/2021 11:35 PM End time: 05/10/2021 11:43 PM  Staffing Anesthesiologist: Mal Amabile, MD Performed: anesthesiologist   Preanesthetic Checklist Completed: patient identified, IV checked, site marked, risks and benefits discussed, surgical consent, monitors and equipment checked, pre-op evaluation and timeout performed  Epidural Patient position: sitting Prep: DuraPrep and site prepped and draped Patient monitoring: continuous pulse ox and blood pressure Approach: midline Location: L3-L4 Injection technique: LOR air  Needle:  Needle type: Tuohy  Needle gauge: 17 G Needle length: 9 cm and 9 Needle insertion depth: 8 cm Catheter type: closed end flexible Catheter size: 19 Gauge Catheter at skin depth: 13 cm Test dose: negative and Other  Assessment Events: blood not aspirated, injection not painful, no injection resistance, no paresthesia and negative IV test  Additional Notes Patient identified. Risks and benefits discussed including failed block, incomplete  Pain control, post dural puncture headache, nerve damage, paralysis, blood pressure Changes, nausea, vomiting, reactions to medications-both toxic and allergic and post Partum back pain. All questions were answered. Patient expressed understanding and wished to proceed. Sterile technique was used throughout procedure. Epidural site was Dressed with sterile barrier dressing. No paresthesias, signs of intravascular injection Or signs of intrathecal spread were encountered.  Patient was more comfortable after the epidural was dosed. Please see RN's note for documentation of vital signs and FHR which are stable. Reason for block:procedure for pain

## 2021-05-10 NOTE — H&P (Signed)
OBSTETRIC ADMISSION HISTORY AND PHYSICAL  Joyce Barnes is a 20 y.o. female G1P0000 with IUP at [redacted]w[redacted]d by 6 week ultrasound presenting for IOL for [facility]. She reports +FMs, No LOF, no VB, no blurry vision, headaches or peripheral edema, and RUQ pain.  She plans on breast feeding. She request POPs for birth control. She received her prenatal care at [facility]   Dating: By 6 weeks --->  Estimated Date of Delivery: 05/17/21  Sono:    @[redacted]w[redacted]d , CWD, normal anatomy, cephalic presentation, 2686g, 05-10-1995 [facility]   Nursing Staff Provider  Office Location  [facility] Dating  6wk 62%  Language  Spanish Anatomy US   normal  Flu Vaccine  04/22/21 Genetic Screen  NIPS: low risk   AFP: neg Horizon: normal  TDaP vaccine   02/21/21 Hgb A1C or  GTT Early - normal Third trimester   Rhogam  n/a   LAB RESULTS   Feeding Plan breast Blood Type O/Positive/-- (02/02 1634)   Contraception undecided Antibody Negative (02/02 1634)  Circumcision no Rubella 9.19 (02/02 1634)  Pediatrician  Given list RPR Non Reactive (07/08 0830)   Support Person Adam (FOB) HBsAg Negative (02/02 1634)   Prenatal Classes  HIV Non Reactive (07/08 0830)  BTL Consent n/a GBS  (For PCN allergy, check sensitivities)   VBAC Consent  N/A Pap Needs pp    Hgb Electro      CF     SMA     Waterbirth  [ ]  Class [ ]  Consent [ ]  CNM visit   Prenatal History/Complications: CHTN  Past Medical History: Past Medical History:  Diagnosis Date   Hypertension     Past Surgical History: Past Surgical History:  Procedure Laterality Date   APPENDECTOMY      Obstetrical History: OB History     Gravida  1   Para  0   Term  0   Preterm  0   AB  0   Living  0      SAB  0   IAB  0   Ectopic  0   Multiple  0   Live Births  0           Social History Social History   Socioeconomic History   Marital status: Single    Spouse name: Not on file   Number of children: Not on file   Years of education: Not on file   Highest education  level: Not on file  Occupational History   Not on file  Tobacco Use   Smoking status: Never   Smokeless tobacco: Never  Vaping Use   Vaping Use: Never used  Substance and Sexual Activity   Alcohol use: Not Currently   Drug use: Never   Sexual activity: Not Currently    Birth control/protection: None  Other Topics Concern   Not on file  Social History Narrative   Not on file   Social Determinants of Health   Financial Resource Strain: Not on file  Food Insecurity: Food Insecurity Present   Worried About Running Out of Food in the Last Year: Sometimes true   Ran Out of Food in the Last Year: Sometimes true  Transportation Needs: No Transportation Needs   Lack of Transportation (Medical): No   Lack of Transportation (Non-Medical): No  Physical Activity: Not on file  Stress: Not on file  Social Connections: Not on file    Family History: Family History  Problem Relation Age of Onset   Healthy Mother  Diabetes Father    Cancer Maternal Aunt     Allergies: No Known Allergies  Medications Prior to Admission  Medication Sig Dispense Refill Last Dose   acetaminophen (TYLENOL) 325 MG tablet Take 2 tablets (650 mg total) by mouth every 4 (four) hours as needed. (Patient not taking: No sig reported) 100 tablet 2    aspirin EC 81 MG tablet Take 1 tablet (81 mg total) by mouth daily. 60 tablet 2    cyclobenzaprine (FLEXERIL) 5 MG tablet Take 1 tablet (5 mg total) by mouth 3 (three) times daily as needed for muscle spasms. 30 tablet 0    labetalol (NORMODYNE) 300 MG tablet Take 1 tablet (300 mg total) by mouth 3 (three) times daily. 90 tablet 3    metoCLOPramide (REGLAN) 10 MG tablet Take 1 tablet (10 mg total) by mouth every 8 (eight) hours as needed (headache). 30 tablet 0    NIFEdipine (PROCARDIA XL/NIFEDICAL XL) 60 MG 24 hr tablet Take 1 tablet (60 mg total) by mouth daily. 30 tablet 5    Prenatal Vit-Fe Fumarate-FA (PRENATAL VITAMINS PO) Take 1 tablet by mouth daily.       Review of Systems   All systems reviewed and negative except as stated in HPI  Blood pressure (!) 144/83, pulse 88, temperature 99.4 F (37.4 C), temperature source Oral, height 5\' 6"  (1.676 m), weight 122.2 kg, last menstrual period 07/06/2020. General appearance: alert, cooperative, and no distress Lungs: clear to auscultation bilaterally Heart: regular rate and rhythm Abdomen: soft, non-tender; bowel sounds normal Pelvic: n/a Extremities: Homans sign is negative, no sign of DVT DTR's +2 Presentation: cephalic Fetal monitoringBaseline: 130 bpm, Variability: Good {> 6 bpm), Accelerations: Reactive, and Decelerations: Absent Uterine activityNone Dilation: Closed Effacement (%): Thick Station: -2 Exam by:: [email or web address] CNM   Prenatal labs: ABO, Rh: --/--/O POS (09/24 0800) Antibody: NEG (09/24 0800) Rubella: 9.19 (02/02 1634) RPR: Non Reactive (07/08 0830)  HBsAg: Negative (02/02 1634)  HIV: Non Reactive (07/08 0830)  GBS: Positive/-- (09/06 1104)   Prenatal Transfer Tool  Maternal Diabetes: No Genetic Screening: Normal Maternal Ultrasounds/Referrals: Normal Fetal Ultrasounds or other Referrals:  Referred to Materal Fetal Medicine  Maternal Substance Abuse:  No Significant Maternal Medications:  None Significant Maternal Lab Results: Group B Strep positive  Results for orders placed or performed during the hospital encounter of 05/10/21 (from the past 24 hour(s))  CBC   Collection Time: 05/10/21  8:00 AM  Result Value Ref Range   WBC 10.4 4.0 - 10.5 K/uL   RBC 4.30 3.87 - 5.11 MIL/uL   Hemoglobin 12.1 12.0 - 15.0 g/dL   HCT 05/12/21 78.4 - 69.6 %   MCV 83.7 80.0 - 100.0 fL   MCH 28.1 26.0 - 34.0 pg   MCHC 33.6 30.0 - 36.0 g/dL   RDW 29.5 28.4 - 13.2 %   Platelets 293 150 - 400 K/uL   nRBC 0.0 0.0 - 0.2 %  Type and screen   Collection Time: 05/10/21  8:00 AM  Result Value Ref Range   ABO/RH(D) O POS    Antibody Screen NEG    Sample Expiration       05/13/2021,2359 Performed at [facility], [address] [place], [place]     Patient Active Problem List   Diagnosis Date Noted   GBS (group B Streptococcus carrier), +RV culture, currently pregnant 04/28/2021   Headache in pregnancy, antepartum, second trimester 02/03/2021   Language barrier 11/21/2020   BMI 40.0-44.9,  adult [facility]) 11/21/2020   Obesity in pregnancy 11/21/2020   Biological false positive RPR test 11/21/2020   Supervision of high risk pregnancy, antepartum 09/18/2020   Chronic hypertension affecting pregnancy 09/18/2020    Assessment/Plan:  Joyce Barnes is a 20 y.o. G1P0000 at [redacted]w[redacted]d here for IOL for CHTN  #Labor: cytotec and discussed FB with next exam if able. Preeclampsia labs pending Continued home labetalol and procardia  #Pain: Per patient request #FWB: Cat 1 #ID:  GBS pos #MOF: Breast #MOC: POP #Circ:  Considering but unsure at this time  Rolm Bookbinder, CNM  05/10/2021, 9:32 AM

## 2021-05-10 NOTE — Anesthesia Preprocedure Evaluation (Addendum)
Anesthesia Evaluation  Patient identified by MRN, date of birth, ID band Patient awake    Reviewed: Allergy & Precautions, Patient's Chart, lab work & pertinent test results  Airway Mallampati: III  TM Distance: >3 FB Neck ROM: Full    Dental no notable dental hx. (+) Teeth Intact   Pulmonary neg pulmonary ROS,    Pulmonary exam normal breath sounds clear to auscultation       Cardiovascular hypertension, Pt. on medications and Pt. on home beta blockers Normal cardiovascular exam Rhythm:Regular Rate:Normal     Neuro/Psych  Headaches, negative psych ROS   GI/Hepatic Neg liver ROS, GERD  Medicated,  Endo/Other  Morbid obesity  Renal/GU negative Renal ROS  negative genitourinary   Musculoskeletal negative musculoskeletal ROS (+)   Abdominal (+) + obese,   Peds  Hematology negative hematology ROS (+)   Anesthesia Other Findings   Reproductive/Obstetrics (+) Pregnancy                             Anesthesia Physical Anesthesia Plan  ASA: 3  Anesthesia Plan: Epidural   Post-op Pain Management:    Induction:   PONV Risk Score and Plan:   Airway Management Planned: Natural Airway  Additional Equipment:   Intra-op Plan:   Post-operative Plan:   Informed Consent: I have reviewed the patients History and Physical, chart, labs and discussed the procedure including the risks, benefits and alternatives for the proposed anesthesia with the patient or authorized representative who has indicated his/her understanding and acceptance.       Plan Discussed with: Anesthesiologist  Anesthesia Plan Comments: (UPDATE 05/11/21 7:55 PM: Patient to go to OR for C section due to failure of descent. Labor epidural in place and functioning well. She was having some increased pelvic discomfort once she reached 10 cm, but has an adequate level after dosing with 2% lido + epi and is more comfortable.  Will plan to use epidural for surgical anesthesia. Discussed with patient. )       Anesthesia Quick Evaluation

## 2021-05-11 ENCOUNTER — Encounter (HOSPITAL_COMMUNITY)
Admission: AD | Disposition: A | Discharge status: Home / Self Care | Payer: Self-pay | Source: Home / Self Care | Attending: Obstetrics and Gynecology

## 2021-05-11 ENCOUNTER — Other Ambulatory Visit: Payer: Self-pay

## 2021-05-11 DIAGNOSIS — Z3A39 39 weeks gestation of pregnancy: Secondary | ICD-10-CM

## 2021-05-11 DIAGNOSIS — O1092 Unspecified pre-existing hypertension complicating childbirth: Secondary | ICD-10-CM

## 2021-05-11 DIAGNOSIS — O99824 Streptococcus B carrier state complicating childbirth: Secondary | ICD-10-CM

## 2021-05-11 LAB — CBC WITH DIFFERENTIAL/PLATELET
Abs Immature Granulocytes: 0.11 10*3/uL — ABNORMAL HIGH (ref 0.00–0.07)
Basophils Absolute: 0 10*3/uL (ref 0.0–0.1)
Basophils Relative: 0 %
Eosinophils Absolute: 0 10*3/uL (ref 0.0–0.5)
Eosinophils Relative: 0 %
HCT: 32.6 % — ABNORMAL LOW (ref 36.0–46.0)
Hemoglobin: 11.1 g/dL — ABNORMAL LOW (ref 12.0–15.0)
Immature Granulocytes: 1 %
Lymphocytes Relative: 6 %
Lymphs Abs: 1.2 10*3/uL (ref 0.7–4.0)
MCH: 28.6 pg (ref 26.0–34.0)
MCHC: 34 g/dL (ref 30.0–36.0)
MCV: 84 fL (ref 80.0–100.0)
Monocytes Absolute: 0.6 10*3/uL (ref 0.1–1.0)
Monocytes Relative: 3 %
Neutro Abs: 20.7 10*3/uL — ABNORMAL HIGH (ref 1.7–7.7)
Neutrophils Relative %: 90 %
Platelets: 289 10*3/uL (ref 150–400)
RBC: 3.88 MIL/uL (ref 3.87–5.11)
RDW: 13.7 % (ref 11.5–15.5)
WBC: 22.7 10*3/uL — ABNORMAL HIGH (ref 4.0–10.5)
nRBC: 0 % (ref 0.0–0.2)

## 2021-05-11 SURGERY — Surgical Case
Anesthesia: Epidural

## 2021-05-11 MED ORDER — STERILE WATER FOR IRRIGATION IR SOLN
Status: DC | PRN
  Administered 2021-05-11: 1

## 2021-05-11 MED ORDER — HYDROMORPHONE HCL 1 MG/ML IJ SOLN: 0.2500 mg | INTRAMUSCULAR | Status: DC | PRN

## 2021-05-11 MED ORDER — LABETALOL HCL 5 MG/ML IV SOLN: 80.0000 mg | INTRAVENOUS | Status: DC | PRN

## 2021-05-11 MED ORDER — SODIUM CHLORIDE 0.9 % IV SOLN
INTRAVENOUS | Status: AC
  Filled 2021-05-11: qty 500

## 2021-05-11 MED ORDER — ACETAMINOPHEN 10 MG/ML IV SOLN
1000.0000 mg | Freq: Once | INTRAVENOUS | Status: DC | PRN
  Administered 2021-05-11: 1000 mg via INTRAVENOUS

## 2021-05-11 MED ORDER — NALBUPHINE HCL 10 MG/ML IJ SOLN: 5.0000 mg | Freq: Once | INTRAMUSCULAR | Status: DC | PRN

## 2021-05-11 MED ORDER — LABETALOL HCL 5 MG/ML IV SOLN: 40.0000 mg | INTRAVENOUS | Status: DC | PRN

## 2021-05-11 MED ORDER — NALBUPHINE HCL 10 MG/ML IJ SOLN
5.0000 mg | INTRAMUSCULAR | Status: DC | PRN
Start: 2021-05-11 — End: 2021-05-14

## 2021-05-11 MED ORDER — DEXAMETHASONE SODIUM PHOSPHATE 10 MG/ML IJ SOLN
INTRAMUSCULAR | Status: DC | PRN
  Administered 2021-05-11: 4 mg via INTRAVENOUS

## 2021-05-11 MED ORDER — MORPHINE SULFATE (PF) 0.5 MG/ML IJ SOLN
INTRAMUSCULAR | Status: AC
  Filled 2021-05-11: qty 10

## 2021-05-11 MED ORDER — ONDANSETRON HCL 4 MG/2ML IJ SOLN: 4.0000 mg | Freq: Three times a day (TID) | INTRAMUSCULAR | Status: DC | PRN

## 2021-05-11 MED ORDER — BUPIVACAINE HCL (PF) 0.25 % IJ SOLN
INTRAMUSCULAR | Status: DC | PRN
  Administered 2021-05-11: 8 mL via EPIDURAL

## 2021-05-11 MED ORDER — CEFAZOLIN SODIUM-DEXTROSE 2-3 GM-%(50ML) IV SOLR
INTRAVENOUS | Status: DC | PRN
  Administered 2021-05-11: 2 g via INTRAVENOUS

## 2021-05-11 MED ORDER — PROMETHAZINE HCL 25 MG/ML IJ SOLN: 6.2500 mg | INTRAMUSCULAR | Status: DC | PRN

## 2021-05-11 MED ORDER — LABETALOL HCL 5 MG/ML IV SOLN
20.0000 mg | INTRAVENOUS | Status: DC | PRN
  Administered 2021-05-11: 20 mg via INTRAVENOUS

## 2021-05-11 MED ORDER — DIPHENHYDRAMINE HCL 50 MG/ML IJ SOLN: 12.5000 mg | INTRAMUSCULAR | Status: DC | PRN

## 2021-05-11 MED ORDER — HYDRALAZINE HCL 20 MG/ML IJ SOLN: 10.0000 mg | INTRAMUSCULAR | Status: DC | PRN

## 2021-05-11 MED ORDER — PHENYLEPHRINE 40 MCG/ML (10ML) SYRINGE FOR IV PUSH (FOR BLOOD PRESSURE SUPPORT)
PREFILLED_SYRINGE | INTRAVENOUS | Status: DC | PRN
  Administered 2021-05-11: 40 ug via INTRAVENOUS

## 2021-05-11 MED ORDER — ONDANSETRON HCL 4 MG/2ML IJ SOLN
INTRAMUSCULAR | Status: DC | PRN
  Administered 2021-05-11: 4 mg via INTRAVENOUS

## 2021-05-11 MED ORDER — OXYCODONE HCL 5 MG PO TABS: 5.0000 mg | ORAL_TABLET | Freq: Once | ORAL | Status: DC | PRN

## 2021-05-11 MED ORDER — SODIUM CHLORIDE 0.9% FLUSH: 3.0000 mL | INTRAVENOUS | Status: DC | PRN

## 2021-05-11 MED ORDER — DIPHENHYDRAMINE HCL 25 MG PO CAPS: 25.0000 mg | ORAL_CAPSULE | ORAL | Status: DC | PRN

## 2021-05-11 MED ORDER — LIDOCAINE-EPINEPHRINE (PF) 2 %-1:200000 IJ SOLN
INTRAMUSCULAR | Status: DC | PRN
  Administered 2021-05-11: 5 mL via INTRADERMAL
  Administered 2021-05-11: 10 mL via INTRADERMAL

## 2021-05-11 MED ORDER — FENTANYL CITRATE (PF) 100 MCG/2ML IJ SOLN
INTRAMUSCULAR | Status: DC | PRN
  Administered 2021-05-11: 100 ug via EPIDURAL

## 2021-05-11 MED ORDER — KETOROLAC TROMETHAMINE 30 MG/ML IJ SOLN: 30.0000 mg | Freq: Four times a day (QID) | INTRAMUSCULAR | Status: AC | PRN

## 2021-05-11 MED ORDER — BUPIVACAINE HCL (PF) 0.5 % IJ SOLN
INTRAMUSCULAR | Status: AC
  Filled 2021-05-11: qty 30

## 2021-05-11 MED ORDER — NALBUPHINE HCL 10 MG/ML IJ SOLN
10.0000 mg | INTRAMUSCULAR | Status: DC | PRN
Start: 2021-05-11 — End: 2021-05-14
  Administered 2021-05-11: 10 mg via INTRAVENOUS
  Filled 2021-05-11: qty 1

## 2021-05-11 MED ORDER — SODIUM CHLORIDE 0.9 % IV SOLN
INTRAVENOUS | Status: DC | PRN
  Administered 2021-05-11: 500 mg via INTRAVENOUS

## 2021-05-11 MED ORDER — SODIUM BICARBONATE 8.4 % IV SOLN: INTRAVENOUS | Status: DC | PRN

## 2021-05-11 MED ORDER — SCOPOLAMINE 1 MG/3DAYS TD PT72
1.0000 | MEDICATED_PATCH | Freq: Once | TRANSDERMAL | Status: DC
  Administered 2021-05-11: 1.5 mg via TRANSDERMAL

## 2021-05-11 MED ORDER — MORPHINE SULFATE (PF) 0.5 MG/ML IJ SOLN
INTRAMUSCULAR | Status: DC | PRN
  Administered 2021-05-11: 3 mg via EPIDURAL

## 2021-05-11 MED ORDER — OXYCODONE HCL 5 MG/5ML PO SOLN
5.0000 mg | Freq: Once | ORAL | Status: DC | PRN
Start: 2021-05-11 — End: 2021-05-11

## 2021-05-11 MED ORDER — NALOXONE HCL 4 MG/10ML IJ SOLN
1.0000 ug/kg/h | INTRAVENOUS | Status: DC | PRN
  Filled 2021-05-11: qty 5

## 2021-05-11 MED ORDER — OXYTOCIN-SODIUM CHLORIDE 30-0.9 UT/500ML-% IV SOLN
INTRAVENOUS | Status: DC | PRN
  Administered 2021-05-11: 250 mL/h via INTRAVENOUS

## 2021-05-11 MED ORDER — NALOXONE HCL 0.4 MG/ML IJ SOLN: 0.4000 mg | INTRAMUSCULAR | Status: DC | PRN

## 2021-05-11 MED ORDER — MEPERIDINE HCL 25 MG/ML IJ SOLN: 6.2500 mg | INTRAMUSCULAR | Status: DC | PRN

## 2021-05-11 MED ORDER — SCOPOLAMINE 1 MG/3DAYS TD PT72
MEDICATED_PATCH | TRANSDERMAL | Status: AC
  Filled 2021-05-11: qty 1

## 2021-05-11 SURGICAL SUPPLY — 32 items
CANISTER WOUND CARE 500ML ATS (WOUND CARE) ×2 IMPLANT
CHLORAPREP W/TINT 26ML (MISCELLANEOUS) ×2 IMPLANT
CLAMP CORD UMBIL (MISCELLANEOUS) IMPLANT
CLOTH BEACON ORANGE TIMEOUT ST (SAFETY) ×2 IMPLANT
DRESSING PREVENA PLUS CUSTOM (GAUZE/BANDAGES/DRESSINGS) ×1 IMPLANT
DRSG OPSITE POSTOP 4X10 (GAUZE/BANDAGES/DRESSINGS) ×2 IMPLANT
DRSG PREVENA PLUS CUSTOM (GAUZE/BANDAGES/DRESSINGS) ×2
ELECT REM PT RETURN 9FT ADLT (ELECTROSURGICAL) ×2
ELECTRODE REM PT RTRN 9FT ADLT (ELECTROSURGICAL) ×1 IMPLANT
EXTRACTOR VACUUM KIWI (MISCELLANEOUS) IMPLANT
GLOVE SURG ENC MOIS LTX SZ6 (GLOVE) ×2 IMPLANT
GLOVE SURG UNDER POLY LF SZ7 (GLOVE) ×4 IMPLANT
GOWN STRL REUS W/TWL LRG LVL3 (GOWN DISPOSABLE) ×4 IMPLANT
KIT ABG SYR 3ML LUER SLIP (SYRINGE) IMPLANT
NEEDLE HYPO 22GX1.5 SAFETY (NEEDLE) IMPLANT
NEEDLE HYPO 25X5/8 SAFETYGLIDE (NEEDLE) IMPLANT
NS IRRIG 1000ML POUR BTL (IV SOLUTION) ×2 IMPLANT
PACK C SECTION WH (CUSTOM PROCEDURE TRAY) ×2 IMPLANT
PAD OB MATERNITY 4.3X12.25 (PERSONAL CARE ITEMS) ×2 IMPLANT
PENCIL SMOKE EVAC W/HOLSTER (ELECTROSURGICAL) ×2 IMPLANT
RETRACTOR WND ALEXIS 25 LRG (MISCELLANEOUS) IMPLANT
RTRCTR WOUND ALEXIS 25CM LRG (MISCELLANEOUS)
SUT PLAIN 2 0 (SUTURE) ×1
SUT PLAIN ABS 2-0 CT1 27XMFL (SUTURE) ×1 IMPLANT
SUT VIC AB 0 CT1 36 (SUTURE) ×8 IMPLANT
SUT VIC AB 2-0 CT1 27 (SUTURE) ×1
SUT VIC AB 2-0 CT1 TAPERPNT 27 (SUTURE) ×1 IMPLANT
SUT VIC AB 4-0 KS 27 (SUTURE) ×2 IMPLANT
SYR CONTROL 10ML LL (SYRINGE) IMPLANT
TOWEL OR 17X24 6PK STRL BLUE (TOWEL DISPOSABLE) ×2 IMPLANT
TRAY FOLEY W/BAG SLVR 14FR LF (SET/KITS/TRAYS/PACK) IMPLANT
WATER STERILE IRR 1000ML POUR (IV SOLUTION) ×2 IMPLANT

## 2021-05-11 NOTE — Discharge Summary (Addendum)
Postpartum Discharge Summary  Date of Service updated 05/14/21     Patient Name: Joyce Barnes DOB: [birth year] MRN: [id number]  Date of admission: 05/10/2021 Delivery date:05/11/2021  Delivering provider: Radene Gunning  Date of discharge: 05/14/2021  Admitting diagnosis: Chronic hypertension affecting pregnancy [O10.919] Intrauterine pregnancy: [redacted]w[redacted]d    Secondary diagnosis:  Active Problems:   Chronic hypertension affecting pregnancy  Additional problems: Obesity, Spanish speaking, GBS positive   Discharge diagnosis: CHTN                                              Post partum procedures: none Augmentation: Pitocin, Cytotec, and IP Foley Complications: None  Hospital course: Induction of Labor With Cesarean Section   20y.o. yo G1P0000 at 355w1das admitted to the hospital 05/10/2021 for induction of labor. Patient had a labor course consisting of 2 courses of cytotec and foley bulb. She was started on pitocin and AROM done once she was almost 5 cm. She progressed quickly to having a slight lip on the left side but felt intense pressure and desired to push. She pushed in broken sessions but ultimately pushed for almost 2 hours. She had made no descent and ultimately was exhausted. She declined additional pushing. The patient went for cesarean section due to Arrest of Descent. Delivery details are as follows: Membrane Rupture Time/Date: 9:58 AM ,05/11/2021   Delivery Method:C-Section, Low Transverse  Details of operation can be found in separate operative Note.  Patient had an uncomplicated postpartum course. She is ambulating, tolerating a regular diet, passing flatus, and urinating well.  Patient is discharged home in stable condition on 05/14/21.      Newborn Data: Birth date:05/11/2021  Birth time:8:41 PM  Gender:Female  Living status:Living  Apgars:7 ,9  Weight:3115 g                                Magnesium Sulfate received: No BMZ received:  No Rhophylac:N/A MMR:N/A T-DaP:Given prenatally Flu: Yes - given prenatally on 9/6 Transfusion:No  Physical exam  Vitals:   05/13/21 0518 05/13/21 1531 05/13/21 1940 05/14/21 0530  BP: 130/70 129/77 125/60 124/74  Pulse: 86 88 87 95  Resp: '18 18 16 18  ' Temp: 98.4 F (36.9 C)  98.5 F (36.9 C) 98.2 F (36.8 C)  TempSrc: Oral  Oral Oral  SpO2:      Weight:      Height:       General: alert, cooperative, and no distress Lochia: appropriate Uterine Fundus: firm Incision: No significant erythema, Dressing is clean, dry, and intact DVT Evaluation: No evidence of DVT seen on physical exam. No significant calf/ankle edema. Labs: Lab Results  Component Value Date   WBC 19.5 (H) 05/12/2021   HGB 9.3 (L) 05/12/2021   HCT 28.2 (L) 05/12/2021   MCV 84.7 05/12/2021   PLT 285 05/12/2021   CMP Latest Ref Rng & Units 05/10/2021  Glucose 70 - 99 mg/dL 75  BUN 6 - 20 mg/dL <5(L)  Creatinine 0.44 - 1.00 mg/dL 0.51  Sodium 135 - 145 mmol/L 135  Potassium 3.5 - 5.1 mmol/L 4.4  Chloride 98 - 111 mmol/L 107  CO2 22 - 32 mmol/L 19(L)  Calcium 8.9 - 10.3 mg/dL 9.2  Total Protein 6.5 - 8.1 g/dL 6.2(L)  Total  Bilirubin 0.3 - 1.2 mg/dL 0.8  Alkaline Phos 38 - 126 U/L 147(H)  AST 15 - 41 U/L 28  ALT 0 - 44 U/L 11   Edinburgh Score: Edinburgh Postnatal Depression Scale Screening Tool 05/13/2021  I have been able to laugh and see the funny side of things. 0  I have looked forward with enjoyment to things. 0  I have blamed myself unnecessarily when things went wrong. 0  I have been anxious or worried for no good reason. 0  I have felt scared or panicky for no good reason. 0  Things have been getting on top of me. 1  I have been so unhappy that I have had difficulty sleeping. 0  I have felt sad or miserable. 0  I have been so unhappy that I have been crying. 0  The thought of harming myself has occurred to me. 0  Edinburgh Postnatal Depression Scale Total 1     After visit meds:   Allergies as of 05/14/2021   No Known Allergies      Medication List     STOP taking these medications    aspirin EC 81 MG tablet   labetalol 300 MG tablet Commonly known as: NORMODYNE   NIFEdipine 60 MG 24 hr tablet Commonly known as: PROCARDIA XL/NIFEDICAL XL       TAKE these medications    acetaminophen 325 MG tablet Commonly known as: Tylenol Take 2 tablets (650 mg total) by mouth every 4 (four) hours as needed.   cyclobenzaprine 5 MG tablet Commonly known as: FLEXERIL Take 1 tablet (5 mg total) by mouth 3 (three) times daily as needed for muscle spasms.   ibuprofen 200 MG tablet Commonly known as: ADVIL Take 1 tablet (200 mg total) by mouth every 6 (six) hours.   metoCLOPramide 10 MG tablet Commonly known as: REGLAN Take 1 tablet (10 mg total) by mouth every 8 (eight) hours as needed (headache).   PRENATAL VITAMINS PO Take 1 tablet by mouth daily.               Discharge Care Instructions  (From admission, onward)           Start     Ordered   05/14/21 0000  Discharge wound care:       Comments: 1 week incision check   05/14/21 0753             Discharge home in stable condition Infant Feeding: Breast Infant Disposition:home with mother Discharge instruction: per After Visit Summary and Postpartum booklet. Activity: Advance as tolerated. Pelvic rest for 6 weeks.  Diet: low salt diet Future Appointments:No future appointments. Follow up Visit:   Please schedule this patient for a In person postpartum visit in 1 week with the following provider: RN. Additional Postpartum F/U:Incision check 1 week and BP check 1 week. She has a prevena and will need to have this removed as well.  High risk pregnancy complicated by: HTN Delivery mode:  C-Section, Low Transverse  Anticipated Birth Control:   POPs  A message was sent to [facility] on 9/25 to schedule her for f/u.   05/14/2021 Joyce Barnes, PGY-1, Faculty service  Midwife  attestation I have seen and examined this patient and agree with above documentation in the resident's note.   Joyce Barnes is a 20 y.o. G1P1001 s/p vaginal delivery.   Pain is well controlled.  Plan for birth control is natural family planning (NFP).  Method of Feeding: breast  PE:  BP 124/74 (BP Location: Right Arm)   Pulse 95   Temp 98.2 F (36.8 C) (Oral)   Resp 18   Ht '5\' 6"'  (1.676 m)   Wt 269 lb 4.8 oz (122.2 kg)   LMP 07/06/2020 (Within Weeks)   SpO2 99%   Breastfeeding Unknown   BMI 43.47 kg/m  Gen: well appearing Heart: reg rate Lungs: normal WOB Fundus firm Ext: soft, no pain, no edema  Recent Labs    05/11/21 2217 05/12/21 0457  HGB 11.1* 9.3*  HCT 32.6* 28.2*     Plan: discharge today - postpartum care discussed - f/u clinic in 6 weeks for postpartum visit   Fatima Blank, CNM 9:08 AM

## 2021-05-11 NOTE — Transfer of Care (Signed)
Immediate Anesthesia Transfer of Care Note  Patient: Joyce Barnes  Procedure(s) Performed: CESAREAN SECTION  Patient Location: PACU  Anesthesia Type:Epidural  Level of Consciousness: awake, alert , oriented and patient cooperative  Airway & Oxygen Therapy: Patient Spontanous Breathing  Post-op Assessment: Report given to RN and Post -op Vital signs reviewed and stable  Post vital signs: Reviewed and stable  Last Vitals:  Vitals Value Taken Time  BP 108/97 05/11/21 2200  Temp    Pulse 88 05/11/21 2205  Resp 23 05/11/21 2205  SpO2 100 % 05/11/21 2205  Vitals shown include unvalidated device data.  Last Pain:  Vitals:   05/11/21 2200  TempSrc:   PainSc: 0-No pain      Patients Stated Pain Goal: 0 (05/11/21 1533)  Complications: No notable events documented.

## 2021-05-11 NOTE — Addendum Note (Signed)
Addendum  created 05/11/21 2324 by Sonda Primes, CRNA   Flowsheet accepted, Intraprocedure Flowsheets edited

## 2021-05-11 NOTE — Progress Notes (Signed)
Joyce Barnes is a 20 y.o. G1P0000 at [redacted]w[redacted]d a/f IOL due to [facility]  She is having pressure with her contractions. Anesthesia has already tried to re-dose her epidural. The patient hit the PCEA. She had Nubain with short term relief. She feels improved when she pushes.   On exam, minimal but some lip of cervix noted on the patient's left side that easily reduces. 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   I pushed several times with the patient - no descent noted and with IUPC, no effort registering.   Reviewed a plan of care with Dorathy Kinsman, CNM -- will do active pushing with the patient trying different positions for the next hour. If no descent at this point, I will likely discuss c-section with the patient. I reviewed this plan with the patient as well with the interpreter translating. Pt agrees with the plan.   Milas Hock 05/11/2021, 6:36 PM

## 2021-05-11 NOTE — Progress Notes (Signed)
Patient ID: Joyce Barnes, female   DOB: 2001-05-24, 20 y.o.   MRN: [id number] Doing well.  Got epidural for pain  Vitals:   05/11/21 0100 05/11/21 0130 05/11/21 0200 05/11/21 0230  BP: (!) 114/58 (!) 113/52 117/61 110/70  Pulse: 83 93 80 88  Resp:      Temp:      TempSrc:      SpO2:      Weight:      Height:       FHR stable UCs coupling now  Pitocin at 13mu/min  RN States cervical exam was actually more like 4-5cm and not 6  Will continue to advance Pitocin

## 2021-05-11 NOTE — Progress Notes (Signed)
Joyce Barnes is a 20 y.o. G1P0000 at [redacted]w[redacted]d.  Subjective: Comfortable w/ epidural   Objective: BP (!) 131/50   Pulse 81   Temp 100 F (37.8 C) (Oral)   Resp 16   Ht 5\' 6"  (1.676 m)   Wt 122.2 kg   LMP 07/06/2020 (Within Weeks)   SpO2 98%   BMI 43.47 kg/m    FHT:  FHR: 130 bpm, variability: mod,  accelerations:  15x15,  decelerations:  1 prolonged decel x 3 minutes that resolved w/ position change. Pitocin halved. No further decels.  UC:   Q 2-3 minutes, mod Dilation: 5 Effacement (%): 50 Cervical Position: Middle Station: -2 Presentation: Vertex Exam by:: [email or web address] CNM AROM small amount of clear fluid. UIPC placed w/out difficulty.   Labs: Results for orders placed or performed during the hospital encounter of 05/10/21 (from the past 24 hour(s))  Protein / creatinine ratio, urine     Status: None   Collection Time: 05/10/21  6:00 PM  Result Value Ref Range   Creatinine, Urine 53.22 mg/dL   Total Protein, Urine 7 mg/dL   Protein Creatinine Ratio 0.13 0.00 - 0.15 mg/mg[Cre]  CBC     Status: Abnormal   Collection Time: 05/10/21  7:12 PM  Result Value Ref Range   WBC 15.5 (H) 4.0 - 10.5 K/uL   RBC 4.48 3.87 - 5.11 MIL/uL   Hemoglobin 12.7 12.0 - 15.0 g/dL   HCT 05/12/21 91.6 - 38.4 %   MCV 83.3 80.0 - 100.0 fL   MCH 28.3 26.0 - 34.0 pg   MCHC 34.0 30.0 - 36.0 g/dL   RDW 66.5 99.3 - 57.0 %   Platelets 310 150 - 400 K/uL   nRBC 0.0 0.0 - 0.2 %    Assessment / Plan: [redacted]w[redacted]d week IUP Labor: Early/IOL. Progressing slowly but cervix soft, thin and pelvis seems adequate and head engaged in pelvis. EFW ~8 lb. IUPC now in place. Titrate pitocin to achieve adequate labor.  Fetal Wellbeing:  Category I, overall reassuring.  Pain Control:  Epidural  Anticipated MOD:  SVD  [redacted]w[redacted]d Katrinka Blazing, CNM 05/11/2021 10:05 AM

## 2021-05-11 NOTE — Progress Notes (Signed)
Joyce Barnes is a 20 y.o. G1P0000 at [redacted]w[redacted]d.  Subjective: Feeling lots of pelvic pressure again. Only got brief relief from dosing of epidural and Nubain.   Objective: BP (!) 153/82   Pulse 80   Temp 99.4 F (37.4 C) (Oral)   Resp 16   Ht 5\' 6"  (1.676 m)   Wt 122.2 kg   LMP 07/06/2020 (Within Weeks)   SpO2 98%   BMI 43.47 kg/m    FHT:  FHR: 130 bpm, variability: mod,  accelerations:  15x15,  decelerations:  none UC:   Q 2-3 minutes, strong Dilation: 10 Effacement (%): 100 Cervical Position: Middle Station: 0 Presentation: Vertex Exam by:: [email or web address] CNM  Labs: Results for orders placed or performed during the hospital encounter of 05/10/21 (from the past 24 hour(s))  CBC     Status: Abnormal   Collection Time: 05/10/21  7:12 PM  Result Value Ref Range   WBC 15.5 (H) 4.0 - 10.5 K/uL   RBC 4.48 3.87 - 5.11 MIL/uL   Hemoglobin 12.7 12.0 - 15.0 g/dL   HCT 05/12/21 46.5 - 03.5 %   MCV 83.3 80.0 - 100.0 fL   MCH 28.3 26.0 - 34.0 pg   MCHC 34.0 30.0 - 36.0 g/dL   RDW 46.5 68.1 - 27.5 %   Platelets 310 150 - 400 K/uL   nRBC 0.0 0.0 - 0.2 %    Assessment / Plan: [redacted]w[redacted]d week IUP Labor: Second stage. Tried pushing x ~15 minutes. Used interpreter for coaching. Not feeling any pushing effort or decent w/ pushing. Pt tired, tearful. Lengthy discussion about laboring down vs continuing to push. Pt wants to labor down. Will continue using epidural PCA. Consider having anesthesia com back to dose Epidural.  Fetal Wellbeing:  Category I Pain Control:  Epidural not working.  Anticipated MOD:  Uncertain. Some concern that lack of decent could be due to CPD. Concern for big baby. Will labor down x 1 hour. If not able to push well or no significant decent will have Dr. [redacted]w[redacted]d assess. Dr. Para March updated w/ labor progress.   Para March, Katrinka Blazing, [place] 05/11/2021 4:55 PM

## 2021-05-11 NOTE — Addendum Note (Signed)
Addendum  created 05/11/21 2219 by Sonda Primes, CRNA   Flowsheet accepted

## 2021-05-11 NOTE — Op Note (Signed)
[facility] PROCEDURE DATE: 05/11/2021  PREOPERATIVE DIAGNOSES: Intrauterine pregnancy at [redacted]w[redacted]d weeks gestation; failure to progress: arrest of descent  POSTOPERATIVE DIAGNOSES: The same plus asynclitic  PROCEDURE: Primary Low Transverse Cesarean Section  SURGEON:  Dr. Milas Hock  ASSISTANT:  Camelia Eng, CNM  ANESTHESIOLOGY TEAM: Anesthesiologist: Mal Amabile, MD; Lucretia Kern, MD CRNA: Sonda Primes, CRNA  INDICATIONS: Joyce Barnes is a 20 y.o. G1P0000 at [redacted]w[redacted]d here for cesarean section secondary to the indications listed under preoperative diagnoses; please see preoperative note for further details.  The risks of surgery were discussed with the patient including but were not limited to: bleeding which may require transfusion or reoperation; infection which may require antibiotics; injury to bowel, bladder, ureters or other surrounding organs; injury to the fetus; need for additional procedures including hysterectomy in the event of a life-threatening hemorrhage; formation of adhesions; placental abnormalities wth subsequent pregnancies; incisional problems; thromboembolic phenomenon and other postoperative/anesthesia complications.  The patient concurred with the proposed plan, giving informed written consent for the procedure.    FINDINGS:  Viable female infant in cephalic presentation but asynclitic.  Apgars not yet reported.   Amniotic fluid: clear.  Intact placenta, three vessel cord.  Normal uterus, fallopian tubes and ovaries bilaterally. Uterine incision noted to have left uterine extension  ANESTHESIA: epidural INTRAVENOUS FLUIDS: 900 ml   ESTIMATED BLOOD LOSS: 685 ml URINE OUTPUT:  200 ml SPECIMENS: Placenta sent to L&D  COMPLICATIONS: None immediate  PROCEDURE IN DETAIL:  The patient preoperatively received intravenous antibiotics and had sequential compression devices applied to her lower extremities.  She was then taken to the operating  room where the epidural was dosed up to a surgical level and found to be adequate. She was then placed in a dorsal supine position with a leftward tilt, and prepped and draped in a sterile manner.  A foley catheter was  was already in place.  After an adequate timeout was performed, a Pfannenstiel skin incision was made with scalpel and carried through to the underlying layer of fascia. The fascia was incised in the midline, and this incision was extended bluntly. The rectus muscles were separated in the midline and the peritoneum was entered bluntly.   The bladder blade retractor was introduced into the abdominal cavity.  Attention was turned to the lower uterine segment where a low transverse hysterotomy was made with a scalpel and extended bilaterally bluntly.  The infant was successfully delivered, the cord was clamped and cut after one minute, and the infant was handed over to the awaiting neonatology team. Uterine massage was then administered, and the placenta delivered intact with a three-vessel cord. The uterus was exteriorized and then cleared of clots and debris.  The extension was identified and the apex identified and grasped with ring forceps.  The extension and hysterotomy was closed with 0 Vicryl in a running locked fashion, and an imbricating layer was also placed with 0 Vicryl.  2 additional figure-of-eight 0 Vicryl serosal stitches were placed to help with hemostasis.    The uterus was returned to the abdomen. The pelvis was cleared of all clot and debris. Hemostasis was confirmed on all surfaces and the apex of the extension.  The retractor was removed.  The peritoneum was closed with a 2-0 Vicryl running stitch. The fascia was then closed using 0 Vicryl in a running fashion.  The subcutaneous layer was irrigated, was reapproximated with 2-0 plain gut in a running fashion. The skin was closed with a  4-0 Vicryl subcuticular stitch. Prevena applied to the incision.   The patient tolerated  the procedure well. Sponge, instrument and needle counts were correct x 3.  She was taken to the recovery room in stable condition.   Milas Hock, MD

## 2021-05-11 NOTE — Progress Notes (Signed)
Lanai Tommi Emery Evelene Croon is a 20 y.o. G1P0000 at [redacted]w[redacted]d.  Subjective: Called to room because patient was extremely uncomfortable.   Objective: BP (!) 151/86   Pulse 97   Temp 99.4 F (37.4 C) (Oral)   Resp 16   Ht 5\' 6"  (1.676 m)   Wt 122.2 kg   LMP 07/06/2020 (Within Weeks)   SpO2 98%   BMI 43.47 kg/m    FHT:  FHR: 135 bpm, variability: mod,  accelerations:  15x15,  decelerations:  no recent decelerations  UC:   Q 2-3 minutes, mod Dilation: Lip/rim Effacement (%): 100 Cervical Position: Middle Station: 0 Presentation: Vertex Exam by:: [email or web address] CNM AROM small amount of clear fluid. UIPC placed w/out difficulty.   Labs: Results for orders placed or performed during the hospital encounter of 05/10/21 (from the past 24 hour(s))  Protein / creatinine ratio, urine     Status: None   Collection Time: 05/10/21  6:00 PM  Result Value Ref Range   Creatinine, Urine 53.22 mg/dL   Total Protein, Urine 7 mg/dL   Protein Creatinine Ratio 0.13 0.00 - 0.15 mg/mg[Cre]  CBC     Status: Abnormal   Collection Time: 05/10/21  7:12 PM  Result Value Ref Range   WBC 15.5 (H) 4.0 - 10.5 K/uL   RBC 4.48 3.87 - 5.11 MIL/uL   Hemoglobin 12.7 12.0 - 15.0 g/dL   HCT 05/12/21 84.6 - 96.2 %   MCV 83.3 80.0 - 100.0 fL   MCH 28.3 26.0 - 34.0 pg   MCHC 34.0 30.0 - 36.0 g/dL   RDW 95.2 84.1 - 32.4 %   Platelets 310 150 - 400 K/uL   nRBC 0.0 0.0 - 0.2 %    Assessment / Plan: [redacted]w[redacted]d week IUP Labor: Early/IOL. EFW ~8 lb. IUPC and FSE now in place. Cervix dilated to a lip on maternal left. Epidural re-dosed and Nubain give. Titrate pitocin to achieve adequate labor. Practice pushes completed with little fetal movement. Will allow to labor down and reassess in 1-2 hours.   Fetal Wellbeing:  Category I, overall reassuring.  Pain Control:  Epidural  Anticipated MOD:  SVD  [redacted]w[redacted]d, MD 05/11/2021 10:05 AM

## 2021-05-11 NOTE — Progress Notes (Signed)
Patient ID: Joyce Barnes, female   DOB: [birth year], 20 y.o.   MRN: [id number] Comfortable with epidural  Vitals:   05/11/21 0330 05/11/21 0400 05/11/21 0430 05/11/21 0500  BP: 123/74 132/73 136/76 (!) 151/84  Pulse: 78 78 87 86  Resp:      Temp:    99 F (37.2 C)  TempSrc:    Oral  SpO2:      Weight:      Height:       FHR reassuring UCs every 2-62min  Pitocin at 93mu/min  Dilation: 4.5 Effacement (%): 80 Cervical Position: Middle Station: -2 Presentation: Vertex Exam by:: Fate Galanti CNM  Continue to observe

## 2021-05-11 NOTE — Progress Notes (Addendum)
Despite pushing well, no change in descent. Head is still at 0 station. Caput present. Pt is exhausted. Offered for her to continue to push. She declines and desires pLTCS. Pitocin turned off and IVF bolus given.   The risks of surgery were discussed with the patient including but were not limited to: bleeding which may require transfusion or reoperation; infection which may require antibiotics; injury to bowel, bladder, ureters or other surrounding organs; injury to the fetus; need for additional procedures including hysterectomy in the event of a life-threatening hemorrhage; formation of adhesions; placental abnormalities with subsequent pregnancies; incisional problems; thromboembolic phenomenon and other postoperative/anesthesia complications.    The patient concurred with the proposed plan, giving informed written consent for the procedure.    Anesthesia and OR aware. Preoperative prophylactic antibiotics and SCDs ordered on call to the OR.  To OR when ready.  Spanish video interpreter used through the entirety of her consent process.   Milas Hock, MD

## 2021-05-11 NOTE — Anesthesia Postprocedure Evaluation (Signed)
Anesthesia Post Note  Patient: Joyce Barnes  Procedure(s) Performed: CESAREAN SECTION     Patient location during evaluation: PACU Anesthesia Type: Epidural Level of consciousness: oriented and awake and alert Pain management: pain level controlled Vital Signs Assessment: post-procedure vital signs reviewed and stable Respiratory status: spontaneous breathing, respiratory function stable and nonlabored ventilation Cardiovascular status: blood pressure returned to baseline and stable Postop Assessment: no headache, no backache, no apparent nausea or vomiting and epidural receding Anesthetic complications: no   No notable events documented.  Last Vitals:  Vitals:   05/11/21 2155 05/11/21 2200  BP: 120/76 (!) 108/97  Pulse: 97 91  Resp:  (!) 21  Temp:    SpO2: 100% 100%    Last Pain:  Vitals:   05/11/21 2200  TempSrc:   PainSc: 0-No pain   Pain Goal: Patients Stated Pain Goal: 0 (05/11/21 1533)  LLE Motor Response: Purposeful movement (05/11/21 2200) LLE Sensation: Tingling (05/11/21 2200) RLE Motor Response: Purposeful movement (05/11/21 2200) RLE Sensation: Tingling (05/11/21 2200)     Epidural/Spinal Function Cutaneous sensation: Tingles (05/11/21 2200), Patient able to flex knees: Yes (05/11/21 2200), Patient able to lift hips off bed: No (05/11/21 2200), Back pain beyond tenderness at insertion site: No (05/11/21 2200), Progressively worsening motor and/or sensory loss: No (05/11/21 2200), Bowel and/or bladder incontinence post epidural: No (05/11/21 2200)  Lucretia Kern

## 2021-05-12 ENCOUNTER — Encounter (HOSPITAL_COMMUNITY): Payer: Self-pay | Admitting: Family Medicine

## 2021-05-12 LAB — CBC
HCT: 28.2 % — ABNORMAL LOW (ref 36.0–46.0)
Hemoglobin: 9.3 g/dL — ABNORMAL LOW (ref 12.0–15.0)
MCH: 27.9 pg (ref 26.0–34.0)
MCHC: 33 g/dL (ref 30.0–36.0)
MCV: 84.7 fL (ref 80.0–100.0)
Platelets: 285 10*3/uL (ref 150–400)
RBC: 3.33 MIL/uL — ABNORMAL LOW (ref 3.87–5.11)
RDW: 13.8 % (ref 11.5–15.5)
WBC: 19.5 10*3/uL — ABNORMAL HIGH (ref 4.0–10.5)
nRBC: 0 % (ref 0.0–0.2)

## 2021-05-12 MED ORDER — PRENATAL MULTIVITAMIN CH
1.0000 | ORAL_TABLET | Freq: Every day | ORAL | Status: DC
  Administered 2021-05-12 – 2021-05-13 (×2): 1 via ORAL
  Filled 2021-05-12 (×3): qty 1

## 2021-05-12 MED ORDER — OXYCODONE HCL 5 MG PO TABS
5.0000 mg | ORAL_TABLET | ORAL | Status: DC | PRN
  Administered 2021-05-13: 5 mg via ORAL
  Filled 2021-05-12: qty 1

## 2021-05-12 MED ORDER — LACTATED RINGERS IV BOLUS
500.0000 mL | Freq: Once | INTRAVENOUS | Status: AC
  Administered 2021-05-12: 500 mL via INTRAVENOUS

## 2021-05-12 MED ORDER — FERROUS SULFATE 325 (65 FE) MG PO TABS
325.0000 mg | ORAL_TABLET | ORAL | Status: DC
  Administered 2021-05-12 – 2021-05-14 (×2): 325 mg via ORAL
  Filled 2021-05-12 (×2): qty 1

## 2021-05-12 MED ORDER — MENTHOL 3 MG MT LOZG: 1.0000 | LOZENGE | OROMUCOSAL | Status: DC | PRN

## 2021-05-12 MED ORDER — HYDROMORPHONE HCL 1 MG/ML IJ SOLN: 0.2000 mg | INTRAMUSCULAR | Status: DC | PRN

## 2021-05-12 MED ORDER — ZOLPIDEM TARTRATE 5 MG PO TABS: 5.0000 mg | ORAL_TABLET | Freq: Every evening | ORAL | Status: DC | PRN

## 2021-05-12 MED ORDER — KETOROLAC TROMETHAMINE 30 MG/ML IJ SOLN
30.0000 mg | Freq: Four times a day (QID) | INTRAMUSCULAR | Status: AC
  Administered 2021-05-12 (×4): 30 mg via INTRAVENOUS
  Filled 2021-05-12 (×3): qty 1

## 2021-05-12 MED ORDER — OXYTOCIN-SODIUM CHLORIDE 30-0.9 UT/500ML-% IV SOLN
2.5000 [IU]/h | INTRAVENOUS | Status: AC
  Administered 2021-05-12 (×2): 2.5 [IU]/h via INTRAVENOUS
  Filled 2021-05-12: qty 500

## 2021-05-12 MED ORDER — ACETAMINOPHEN 500 MG PO TABS
1000.0000 mg | ORAL_TABLET | Freq: Four times a day (QID) | ORAL | Status: DC
  Administered 2021-05-12 – 2021-05-14 (×7): 1000 mg via ORAL
  Filled 2021-05-12 (×8): qty 2

## 2021-05-12 MED ORDER — DIPHENHYDRAMINE HCL 25 MG PO CAPS: 25.0000 mg | ORAL_CAPSULE | Freq: Four times a day (QID) | ORAL | Status: DC | PRN

## 2021-05-12 MED ORDER — DIBUCAINE (PERIANAL) 1 % EX OINT: 1.0000 "application " | TOPICAL_OINTMENT | CUTANEOUS | Status: DC | PRN

## 2021-05-12 MED ORDER — COCONUT OIL OIL: 1.0000 "application " | TOPICAL_OIL | Status: DC | PRN

## 2021-05-12 MED ORDER — IBUPROFEN 600 MG PO TABS
600.0000 mg | ORAL_TABLET | Freq: Four times a day (QID) | ORAL | Status: DC
  Administered 2021-05-13 – 2021-05-14 (×6): 600 mg via ORAL
  Filled 2021-05-12 (×7): qty 1

## 2021-05-12 MED ORDER — SIMETHICONE 80 MG PO CHEW: 80.0000 mg | CHEWABLE_TABLET | ORAL | Status: DC | PRN

## 2021-05-12 MED ORDER — KETOROLAC TROMETHAMINE 30 MG/ML IJ SOLN
30.0000 mg | Freq: Once | INTRAMUSCULAR | Status: DC
  Filled 2021-05-12: qty 1

## 2021-05-12 MED ORDER — SENNOSIDES-DOCUSATE SODIUM 8.6-50 MG PO TABS
2.0000 | ORAL_TABLET | Freq: Every day | ORAL | Status: DC
  Administered 2021-05-13 – 2021-05-14 (×2): 2 via ORAL
  Filled 2021-05-12 (×2): qty 2

## 2021-05-12 MED ORDER — WITCH HAZEL-GLYCERIN EX PADS: 1.0000 "application " | MEDICATED_PAD | CUTANEOUS | Status: DC | PRN

## 2021-05-12 MED ORDER — SIMETHICONE 80 MG PO CHEW
80.0000 mg | CHEWABLE_TABLET | Freq: Three times a day (TID) | ORAL | Status: DC
  Administered 2021-05-12 – 2021-05-14 (×6): 80 mg via ORAL
  Filled 2021-05-12 (×7): qty 1

## 2021-05-12 MED ORDER — GABAPENTIN 100 MG PO CAPS
100.0000 mg | ORAL_CAPSULE | Freq: Every day | ORAL | Status: DC
  Administered 2021-05-12 – 2021-05-13 (×3): 100 mg via ORAL
  Filled 2021-05-12 (×3): qty 1

## 2021-05-12 MED ORDER — TETANUS-DIPHTH-ACELL PERTUSSIS 5-2.5-18.5 LF-MCG/0.5 IM SUSY: 0.5000 mL | PREFILLED_SYRINGE | Freq: Once | INTRAMUSCULAR | Status: DC

## 2021-05-12 MED ORDER — ENOXAPARIN SODIUM 60 MG/0.6ML IJ SOSY
0.5000 mg/kg | PREFILLED_SYRINGE | INTRAMUSCULAR | Status: DC
  Administered 2021-05-12 – 2021-05-13 (×2): 60 mg via SUBCUTANEOUS
  Filled 2021-05-12 (×2): qty 0.6

## 2021-05-12 NOTE — Lactation Note (Signed)
This note was copied from a baby's chart. Lactation Consultation Note  Patient Name: Joyce Barnes [id number] Date: 05/12/2021 Reason for consult: Follow-up assessment Age:22 hours  Consult was done in Spanish: LC in to room to complete manual pump and nipple shield education. "Joyce Barnes" is skin to skin after his bath. Demonstrated hand pump use with 21-mm flange as well as 20-mm nipple shield placement. Talked about care of parts, pumping frequency and milk storage.  Colostrum is easily expressed. Encouraged to contact [facility] as needed for support or questions.   Maternal Data Has patient been taught Hand Expression?: Yes  Feeding Mother's Current Feeding Choice: Breast Milk and Formula Nipple Type: Extra Slow Flow  LATCH Score Latch: Repeated attempts needed to sustain latch, nipple held in mouth throughout feeding, stimulation needed to elicit sucking reflex.  Audible Swallowing: A few with stimulation  Type of Nipple: Inverted  Comfort (Breast/Nipple): Soft / non-tender  Hold (Positioning): Assistance needed to correctly position infant at breast and maintain latch.  LATCH Score: 5   Lactation Tools Discussed/Used Tools: Pump;Flanges;Nipple Shields Nipple shield size: 20 Flange Size: 21 Breast pump type: Manual Pump Education: Setup, frequency, and cleaning;Milk Storage Reason for Pumping: stimulation and supplementation Pumping frequency: as needed Pumped volume: 1 mL (with demonstration)  Interventions Interventions: Education;Hand pump;Expressed milk;Breast massage;Hand express;Pre-pump if needed (nipple shield use)  Consult Status Consult Status: Follow-up Date: 05/13/21 Follow-up type: In-patient    Nicholson Starace A Higuera Ancidey 05/12/2021, 4:19 PM

## 2021-05-12 NOTE — Progress Notes (Addendum)
Subjective: Postpartum Day #1 Cesarean Delivery Patient reports no pain; has not been OOB yet.   Objective: Vital signs in last 24 hours: Temp:  [97.7 F (36.5 C)-100 F (37.8 C)] 98 F (36.7 C) (09/26 0630) Pulse Rate:  [70-97] 77 (09/26 0220) Resp:  [16-21] 18 (09/26 0630) BP: (93-176)/(50-105) 103/55 (09/26 0630) SpO2:  [96 %-100 %] 97 % (09/26 0630)  Physical Exam:  General: alert, cooperative, and no distress Lochia: appropriate Uterine Fundus: firm Incision: prevena dressing in place DVT Evaluation: venodyne boots in place Foley catheter in place draining clear yellow urine  Recent Labs    05/11/21 2217 05/12/21 0457  HGB 11.1* 9.3*  HCT 32.6* 28.2*   Patient Vitals for the past 24 hrs:  BP Temp Temp src Pulse Resp SpO2  05/12/21 0630 (!) 103/55 98 F (36.7 C) Oral -- 18 97 %  05/12/21 0600 -- -- -- -- -- 97 %  05/12/21 0220 (!) 118/57 98.9 F (37.2 C) Oral 77 18 96 %  05/12/21 0120 (!) 103/55 97.7 F (36.5 C) Oral 74 18 99 %  05/12/21 0020 (!) 110/55 99.3 F (37.4 C) Oral 80 18 99 %  05/11/21 2320 (!) 122/58 98.3 F (36.8 C) Oral 70 18 97 %  05/11/21 2300 (!) 141/68 -- -- 85 16 99 %  05/11/21 2231 93/68 97.8 F (36.6 C) -- 86 20 --  05/11/21 2215 140/62 -- -- 88 20 99 %  05/11/21 2200 (!) 108/97 98 F (36.7 C) Axillary 91 (!) 21 100 %  05/11/21 2155 120/76 -- -- 97 -- 100 %  05/11/21 2012 -- -- -- -- 18 --  05/11/21 2011 (!) 148/69 -- -- 89 -- --  05/11/21 2007 138/80 -- -- 84 -- --  05/11/21 2002 (!) 176/94 -- -- 89 -- --  05/11/21 1854 (!) 159/85 -- -- 91 -- --  05/11/21 1757 -- 99.4 F (37.4 C) Oral -- -- --  05/11/21 1712 (!) 153/82 -- -- 80 -- --  05/11/21 1637 (!) 154/76 -- -- 87 -- --  05/11/21 1632 (!) 137/105 -- -- -- -- --  05/11/21 1627 121/80 -- -- 93 -- --  05/11/21 1622 (!) 144/74 -- -- 87 -- --  05/11/21 1617 128/66 -- -- 84 -- --  05/11/21 1612 122/80 -- -- 81 -- --  05/11/21 1611 122/80 -- -- 81 -- --  05/11/21 1607 (!) 160/74  -- -- 81 -- --  05/11/21 1602 (!) 151/89 -- -- 79 -- --  05/11/21 1557 139/82 -- -- 79 -- --  05/11/21 1552 (!) 112/98 -- -- 92 -- --  05/11/21 1546 (!) 133/94 -- -- 94 -- --  05/11/21 1544 (!) 156/100 -- -- 91 -- --  05/11/21 1542 (!) 156/85 -- -- 90 -- --  05/11/21 1502 (!) 151/86 -- -- 97 -- --  05/11/21 1410 -- 99.4 F (37.4 C) Oral -- -- --  05/11/21 1402 122/74 -- -- 86 -- --  05/11/21 1233 (!) 131/50 -- -- 81 -- --  05/11/21 1202 (!) 125/58 -- -- 77 -- --  05/11/21 1106 -- 100 F (37.8 C) Oral -- -- --  05/11/21 1054 (!) 145/74 -- -- 97 -- --  05/11/21 1032 136/80 -- -- 88 -- --  05/11/21 0931 (!) 141/82 -- -- 85 -- --  05/11/21 0835 132/61 -- -- 87 -- --  05/11/21 0802 (!) 129/53 -- -- 75 -- --    Assessment/Plan: Status post Cesarean section. Doing well  postoperatively.  Continue current care. BPs have been normal overnight; will D/C labetalol and restart nifedipine Joyce Barnes 05/12/2021, 7:41 AM

## 2021-05-12 NOTE — Lactation Note (Signed)
This note was copied from a baby's chart. Lactation Consultation Note  Patient Name: Joyce Barnes Today's Date: 05/12/2021   Age:22 hours  Bonnye Fava, in-house interpreter, present for consult. Mom is a P1 who noticed increased veining on breasts with pregnancy. When I entered room, infant was sleeping on Mom's chest having recently been fed formula.   Mom said infant had a difficult time with latching; Mom affirmed that she does want help with feeding infant at the breast. Mom will use her call-bell button and call for me when she'd like for me to return.   Mom has an electric pump at home that comes with size 24 flanges. Mom's nipple diameter suggests she needs size 21 flanges. I provided a hand pump with size 21 flanges & told her I'll return to show her how to use it.   Mom commented that the slow-flow nipples used with bottle feeding may have been too fast. I provided a couple of extra-slow flow nipples for her to try.    Lurline Hare [facility] 05/12/2021, 8:13 AM

## 2021-05-13 NOTE — Lactation Note (Signed)
This note was copied from a baby's chart. Lactation Consultation Note  Patient Name: Joyce Barnes [id number] Date: 05/13/2021 Reason for consult: Follow-up assessment;Term;Primapara;1st time breastfeeding Age:22 hours   P1 mother whose infant is now 81 hours old.  This is a term baby at 39+1 weeks.  Mother's feeding preference is breast/formula.  Attempted to use in house Spanish interpreter, however, no one answered the call.  Spanish interpreter, Kern Alberta [phone]) used for interpretation.  Mother reported that she does not have any colostrum and is formula feeding baby.  Education completed on the importance of frequent feedings and breast stimulation to help ensure a good milk supply.  Upon observation, mother's breasts are soft and non tender and nipples are flat.  She appears to have slight bruising to the right breast.  Mother has a #20 NS at bedside but has not been using it.  Education provided on the benefits to beginning to pump with the DEBP and mother willing to do this.  Pump parts, assembly and cleaning reviewed.  Wash station set up.  Asked mother to call her RN/LC for latch assistance as needed.  Reviewed supplementation guidelines with mother.  Father returned to the room at the end of my visit.  Mother has a DEBP for home use.  RN updated.    Maternal Data Has patient been taught Hand Expression?: Yes Does the patient have breastfeeding experience prior to this delivery?: No  Feeding Mother's Current Feeding Choice: Breast Milk and Formula  LATCH Score                    Lactation Tools Discussed/Used Tools: Pump;Flanges;Nipple Shields Nipple shield size: 20 Flange Size: 24;21 Breast pump type: Double-Electric Breast Pump;Manual Pump Education: Setup, frequency, and cleaning Reason for Pumping: Nipple eversion; pumping due to NS use Pumping frequency: Every three hours  Interventions Interventions: Education  Discharge Pump:  Personal  Consult Status Consult Status: Follow-up Date: 05/14/21 Follow-up type: In-patient    Sparkles Mcneely R Levenia Skalicky 05/13/2021, 11:36 AM

## 2021-05-13 NOTE — Progress Notes (Addendum)
Joyce Barnes [id number] Postpartum Day Two S/P Primary Cesarean Section due to Arrest of Descent  Subjective: Patient up ad lib, denies syncope or dizziness. Reports consuming regular diet without issues and denies N/V. Patient reports no bowel movement, but is passing flatus.  Denies issues with urination and reports bleeding is "getting better."  Patient is breastfeeding and reports some issues.  Desires POPs for postpartum contraception.  Pain is being appropriately managed with use of ibuprofen.   Objective: Temp:  [98 F (36.7 C)-99.1 F (37.3 C)] 98.4 F (36.9 C) (09/27 0518) Pulse Rate:  [86-90] 86 (09/27 0518) Resp:  [16-18] 18 (09/27 0518) BP: (98-130)/(50-70) 130/70 (09/27 0518) SpO2:  [97 %-99 %] 99 % (09/26 1527) Vitals:   05/12/21 0927 05/12/21 1527 05/12/21 1950 05/13/21 0518  BP: (!) 98/59 (!) 110/50 (!) 119/55 130/70  Pulse: 90 89 86 86  Resp: 16 18 16 18   Temp: 99.1 F (37.3 C) 98.7 F (37.1 C) 98.6 F (37 C) 98.4 F (36.9 C)  TempSrc: Oral Oral Oral Oral  SpO2: 98% 99%    Weight:      Height:        Recent Labs    05/10/21 1912 05/11/21 2217 05/12/21 0457  HGB 12.7 11.1* 9.3*  HCT 37.3 32.6* 28.2*  WBC 15.5* 22.7* 19.5*    Physical Exam:  General: alert, cooperative, and no distress Mood/Affect: Appropriate/Bright Lungs: clear to auscultation, no wheezes, rales or rhonchi, symmetric air entry.  Heart: normal rate and regular rhythm. Breast: lactating, no erythema or tenderness, nipples normal. Right Nipple with some cracking, but in healing phase.  Abdomen:  + bowel sounds, Soft, NT Incision: Wound Vac in Place Uterine Fundus: firm Lochia: appropriate Skin: Warm, Dry. DVT Evaluation: No evidence of DVT seen on physical exam. Calf/Ankle edema is present. Drain:  N/A  Assessment Post Operative Day Two S/P Primary C/S Normal Involution Breast and Bottle Feeding Hemodynamically Stable  Plan: -Discharge teaching and  planning. -Informed that discharge to occur today if peds releases baby. -Discussed need for follow up in one week for incision and bp check. -Patient without questions or concerns.  -Encouraged continue utilization of lactation consults.  -Continue other mgmt as ordered -L&D Team to be updated on patient status -Interpretations completed with assistance of video interpreter: 05/14/21 [phone].   [id number] MSN, CNM 05/13/2021, 6:25 AM

## 2021-05-14 MED ORDER — IBUPROFEN 200 MG PO TABS: 600.0000 mg | ORAL_TABLET | Freq: Four times a day (QID) | ORAL | 0 refills | Status: DC | PRN

## 2021-05-14 MED ORDER — IBUPROFEN 200 MG PO TABS: 200.0000 mg | ORAL_TABLET | Freq: Four times a day (QID) | ORAL | 0 refills | Status: DC

## 2021-05-14 NOTE — Lactation Note (Signed)
This note was copied from a baby's chart. Lactation Consultation Note  Patient Name: Boy Indiah Loan Oguin FGHWE'X Date: 05/14/2021 Reason for consult: Follow-up assessment Age:22 hours  LC in to room prior to discharge. Parents state infant is getting only formula because there is not breast milk. Reviewed supplementation volume according to age. Reinforced pacing, upright position and frequent burping. Discussed normal infant behavior and expected output. Talked about milk coming into volume. Urged mother to continue pumping for at least 15 minutes and use nipple shield as needed when attempting latch.   Feeding plan:  1-Feeding on demand or 8-12 times in 24h period. 2-Pump or hand-express after feedings for stimulation and when using nipple shield. 3-Encouraged maternal rest, hydration and food intake.   Contact LC as needed for feeds/support/concerns/questions. All questions answered at this time. Reviewed LC brochure and other resources.   Maternal Data Has patient been taught Hand Expression?: Yes Does the patient have breastfeeding experience prior to this delivery?: No  Feeding Mother's Current Feeding Choice: Breast Milk and Formula  Interventions Interventions: Education;Pace feeding;Expressed milk;DEBP;Hand pump;Hand express;Breast massage;Skin to skin;Breast feeding basics reviewed  Discharge Discharge Education: Engorgement and breast care;Warning signs for feeding baby Pump: Personal  Consult Status Consult Status: Complete Date: 05/14/21 Follow-up type: Call as needed    Batool Majid A Higuera Ancidey 05/14/2021, 12:25 PM

## 2021-05-14 NOTE — Progress Notes (Signed)
Ordered pt lunch by Orlan Leavens  Spanish Medical Interpreter.

## 2021-05-21 ENCOUNTER — Ambulatory Visit (INDEPENDENT_AMBULATORY_CARE_PROVIDER_SITE_OTHER): Payer: Self-pay

## 2021-05-21 ENCOUNTER — Other Ambulatory Visit: Payer: Self-pay

## 2021-05-21 VITALS — BP 142/90 | HR 112 | Wt 250.3 lb

## 2021-05-21 DIAGNOSIS — Z5189 Encounter for other specified aftercare: Secondary | ICD-10-CM

## 2021-05-21 MED ORDER — NIFEDIPINE ER OSMOTIC RELEASE 30 MG PO TB24: 30.0000 mg | ORAL_TABLET | Freq: Every day | ORAL | 0 refills | Status: DC

## 2021-05-21 NOTE — Progress Notes (Signed)
Patient was assessed and managed by nursing staff during this encounter. I have reviewed the chart and agree with the documentation and plan.   Danyell Awbrey, MD 05/21/2021 4:45 PM 

## 2021-05-21 NOTE — Progress Notes (Signed)
Pt here today for BP check and incision check.  With Spanish Interpreter Eda R., pt reports that her BP medication was dc'd when discharged from the hospital.  BP 152/100 LA.  Rpt BP 142/90 RA.  Pt denies headache and visual disturbances. Reveiwed BP's with Dr. Sarina Ill who recommends pt to start Nifedipine 30 mg po daily and have a nurse visit next week for a BP check after starting medication.   Pt states that she is able to come in on October 10th @ 1430.    Provena wound vac removed.  Pt tolerated well.  Incision well approximated- no odor, no drainage, no redness, and no edema.  Observed incision with approximately 1-2 mm section that is in healing process.  Pt advised to continue to monitor.  Allow warm, soapy water to run over the incision and pat dry.  Pt advised to continue to monitor for signs of infection.    Addison Naegeli, RN  05/21/21

## 2021-05-22 ENCOUNTER — Ambulatory Visit: Payer: Self-pay

## 2021-05-23 ENCOUNTER — Telehealth (HOSPITAL_COMMUNITY): Payer: Self-pay

## 2021-05-23 NOTE — Telephone Encounter (Signed)
"  Everything is good thank goodness. Everything is fine." Patient has no questions or concerns about her healing.  "Baby is really good,really well. Baby has a pediatrician appointment on Monday for a weight check. Baby sleeps in a crib next to my bed." RN reviewed ABC's of safe sleep with patient. Patient declines any questions or concerns about baby.  EPDS score is  0.  Marcelino Duster [facility] 05/23/2021,1854

## 2021-05-26 ENCOUNTER — Other Ambulatory Visit: Payer: Self-pay

## 2021-05-26 ENCOUNTER — Ambulatory Visit: Payer: Self-pay

## 2021-05-26 ENCOUNTER — Ambulatory Visit (INDEPENDENT_AMBULATORY_CARE_PROVIDER_SITE_OTHER): Payer: Self-pay | Admitting: Family Medicine

## 2021-05-26 DIAGNOSIS — Z3A00 Weeks of gestation of pregnancy not specified: Secondary | ICD-10-CM

## 2021-05-26 DIAGNOSIS — R030 Elevated blood-pressure reading, without diagnosis of hypertension: Secondary | ICD-10-CM

## 2021-05-26 DIAGNOSIS — O10919 Unspecified pre-existing hypertension complicating pregnancy, unspecified trimester: Secondary | ICD-10-CM

## 2021-05-26 DIAGNOSIS — Z0130 Encounter for examination of blood pressure without abnormal findings: Secondary | ICD-10-CM

## 2021-05-26 DIAGNOSIS — O99891 Other specified diseases and conditions complicating pregnancy: Secondary | ICD-10-CM

## 2021-05-26 NOTE — Progress Notes (Signed)
Error

## 2021-05-26 NOTE — Progress Notes (Signed)
Pt called with interpreter Raquel. Pt states missed morning appt due to baby appt. Pt was placed on new BP med x 1 week ago due to elevated BP. Pt has BP cuff at home. Pt states unable to take BP at home due to fail of BP cuff with home machine. Pt advised to come in for another nurse visit. Pt agreeable to plan of care. Pt scheduled on 10/14 at 1020am per pt request of date and time of appt.   Pt denies headaches, visual changes or swelling. Pt states has been taking Rx Procardia 30 mg daily for 1 week.   Laney Pastor

## 2021-05-26 NOTE — Progress Notes (Signed)
Patient seen and assessed by nursing staff.  Agree with documentation and plan.  

## 2021-05-27 ENCOUNTER — Encounter: Payer: Self-pay | Admitting: Family Medicine

## 2021-05-30 ENCOUNTER — Ambulatory Visit (INDEPENDENT_AMBULATORY_CARE_PROVIDER_SITE_OTHER): Payer: Self-pay

## 2021-05-30 ENCOUNTER — Other Ambulatory Visit: Payer: Self-pay

## 2021-05-30 VITALS — BP 122/82 | HR 79 | Wt 250.0 lb

## 2021-05-30 DIAGNOSIS — O10919 Unspecified pre-existing hypertension complicating pregnancy, unspecified trimester: Secondary | ICD-10-CM

## 2021-05-30 NOTE — Progress Notes (Signed)
Pt here today for PP Blood pressure check.  Pt was see on 10/10 for BP check and was elevated, placed on Rx Procardia 30mg  daily. Pt denies any headaches, dizziness, visual changes. No swelling noted at today's visit. Pt currently taking Procardia 30mg  daily.   BP today: 122/82   Pt advised to continue to take BP meds until PP appt.   Pt has PP visit on 06/23/21.  Pt also asking to see Lactation. Lactation appt made for 10/18 at 1:15pm. Pt agreeable to date and time of appt.   13/7/22, RN

## 2021-06-02 NOTE — Progress Notes (Signed)
Patient was assessed and managed by nursing staff during this encounter. I have reviewed the chart and agree with the documentation and plan. I have also made any necessary editorial changes.  Sukhman Kocher, MD 06/02/2021 12:02 AM   

## 2021-06-23 ENCOUNTER — Ambulatory Visit: Payer: Self-pay | Admitting: Obstetrics & Gynecology

## 2021-07-08 ENCOUNTER — Other Ambulatory Visit: Payer: Self-pay

## 2021-07-08 ENCOUNTER — Encounter: Payer: Self-pay | Admitting: Family Medicine

## 2021-07-08 ENCOUNTER — Ambulatory Visit (INDEPENDENT_AMBULATORY_CARE_PROVIDER_SITE_OTHER): Payer: Medicaid Other | Admitting: Family Medicine

## 2021-07-08 DIAGNOSIS — I10 Essential (primary) hypertension: Secondary | ICD-10-CM | POA: Diagnosis not present

## 2021-07-08 DIAGNOSIS — Z3009 Encounter for other general counseling and advice on contraception: Secondary | ICD-10-CM | POA: Diagnosis not present

## 2021-07-08 NOTE — Progress Notes (Signed)
Post Partum Visit Note  Joyce Barnes is a 20 y.o. G68P1001 female who presents for a postpartum visit. She is 8 weeks postpartum following a primary cesarean section.  I have fully reviewed the prenatal and intrapartum course. The delivery was at [redacted]w[redacted]d gestational weeks.  Anesthesia: epidural. Postpartum course has been good. Baby is doing well. Baby is feeding by bottle - Enfamil . Bleeding no bleeding. Bowel function is normal. Bladder function is normal. Patient is not sexually active. Contraception method is none currently  Postpartum depression screening: negative.  Discussed contraception options at length today, she would like to proceed with the nexplanon. However, she wants to postpone this and do her papsmear + nexplanon on the next visit.   She has known chronic hypertension, was not on medications prior to pregnancy. Does not have a PCP right now. BP cuff at home isn't working anymore (states "it won't inflate").    The pregnancy intention screening data noted above was reviewed. Potential methods of contraception were discussed. The patient elected to proceed with No data recorded.   Edinburgh Postnatal Depression Scale - 07/08/21 1035       Edinburgh Postnatal Depression Scale:  In the Past 7 Days   I have been able to laugh and see the funny side of things. 0    I have looked forward with enjoyment to things. 0    I have blamed myself unnecessarily when things went wrong. 0    I have been anxious or worried for no good reason. 0    I have felt scared or panicky for no good reason. 0    Things have been getting on top of me. 0    I have been so unhappy that I have had difficulty sleeping. 0    I have felt sad or miserable. 0    I have been so unhappy that I have been crying. 0    The thought of harming myself has occurred to me. 0    Edinburgh Postnatal Depression Scale Total 0             Health Maintenance Due  Topic Date Due   Hepatitis C Screening   Never done   PAP-Cervical Cytology Screening  Never done   PAP SMEAR-Modifier  Never done     Review of Systems Pertinent items are noted in HPI.  Objective:  BP 133/79   Pulse 99   Wt 251 lb 11.2 oz (114.2 kg)   Breastfeeding No   BMI 40.63 kg/m    General:  alert, cooperative, and appears stated age   Breasts:  not indicated  Lungs: Normal WOB  Heart:  regular rate and rhythm  Abdomen: Soft, non-distended    Wound well approximated incision  GU exam:  not indicated       Assessment:   Normal postpartum exam.  Chronic Hypertension Contraception counseling   Plan:   Essential components of care per ACOG recommendations:  1.  Mood and well being: Patient with negative depression screening today. Reviewed local resources for support.  - Patient tobacco use? No.   - hx of drug use? No.    2. Infant care and feeding:  -Patient currently breastmilk feeding? No.  -Social determinants of health (SDOH) reviewed in EPIC. No concerns.  3. Sexuality, contraception and birth spacing - Patient does not want a pregnancy in the next year.  Desired family size is undecided.  - Reviewed forms of contraception in tiered fashion. Patient  desired  nexplanon, does not want it placed today. Prefers to schedule an appointment for pap and nexplanon only.   - Discussed birth spacing of 18 months  4. Sleep and fatigue -Encouraged family/partner/community support of 4 hrs of uninterrupted sleep to help with mood and fatigue  5. Physical Recovery  - Discussed patients delivery and complications. She describes her labor as good. - Patient had a C-section for arrest of descent.  - Patient has urinary incontinence? No. - Patient is safe to resume physical and sexual activity  6.  Health Maintenance - HM due items addressed Yes - Last pap smear never done. Pap smear to be done on next visit.  -Breast Cancer screening indicated? No.   7. Chronic Disease/Pregnancy Condition follow up:  Hypertension --Chronic hypertension: BP 133/79 on recheck today, still taking procardia. No symptoms. Can continue on this for now. Recommended bringing in BP cuff at next visit to see if we can correct it. Encouraged establishing with a PCP, given list of offices in the area.   Follow up in 1-2 weeks for pap and nexplanon insertion. Recommended continued abstinence until placement. Bring BP cuff at that visit.    Allayne Stack, DO [facility] [employer], [facility]

## 2021-07-08 NOTE — Patient Instructions (Signed)
AREA FAMILY PRACTICE PHYSICIANS  [place] ([address]) [facility] [address] [place], [place] [address] [phone] Mon-Fri 8:30-12:30, 1:30-5:00 Accepting Medicaid [facility] [address], [place], [place] [address] [phone] Mon-Fri 8:00-5:30 [facility] [address] [place], [place] [address] [phone] Mon, Tue, Thur, Fri 8:30-5:00, Wed 10:00-7:00 (closed 1-2pm) Accepting Medicaid [facility] [address], [place], [place]  [address] Phone - [phone]   Fax - [phone]  [place] ([address]) [facility] [address] [place], [place] [address] [phone] Mon-Fri 8:00-5:00 [facility] ([facility])  [address] [place], [place] [address] [phone] Mon-Fri 8:30-5:30, Sat (Oct.-Mar.) 9:00-1:00 Accepting Medicaid  [place] ([address]) [facility] [address], [place], [place] [address] [phone] Mon-Fri 8:00-5:00  [place] ([address]) [facility] [address], [place], [place] [address] [phone] Mon-Fri 8:00-5:00 [facility] [address], [place], [place] [address] [phone] Mon-Fri 8:00-5:00 [facility] [address] [place], [place] [address] [phone] Mon-Fri 8:00-5:00 [facility] [address] [place] [address] [phone] Mon-Fri 7:30-5:30  [place] ([address] & [address]) [facility] [address] [place], [place] [address] [phone] Mon-Thur 8:00-6:00 Accepting Medicaid [facility] [address] [place], [place] [address] [phone] Mon-Thur 7:30-7:30, Fri 7:30-4:30 Accepting  Medicaid [facility] [address], [place], [place]  [address] [phone]   Fax - [phone]  [place] ([address] & [address]) [facility] [address] [place], [place] [address] [phone] Mon-Fri 7:00-5:00 [facility] [address], [place], [place] [address] [phone] Mon-Fri 8:00-5:00 Accepting Medicaid [facility] [address], [place], [place] [address] [phone] Mon-Fri 8:00-5:00 Accepting Medicaid  [place] ([address]) [facility] [place] [address] [place], [place] [address] [phone] Mon-Fri 8:00-5:00 [facility] [address], [place], [place] [address] [phone] Mon-Fri 8:00-5:00 Accepting Medicaid [facility] ([facility]) [address], [place], [place] [address] [phone] Mon-Fri 8:00-5:30, Sat&Sun by appointment (phones open at 8:30) Accepting Medicaid  [place] ([address]) [facility] [address] [place], [place] [address] [phone] Mon-Thur 8:00-7:00, Fri 8:00-5:00, Sat 8:00-12:00, Sun 9:00-12:00 Accepting Medicaid [facility] [address], [place], [place] [address] [phone] Mon-Thur 8:00-5:00 Accepting Medicaid [facility] [address] [place], [place] [address] [phone] Mon-Fri 8:00-5:30, Sat (Oct.-Mar.) 9:00-1:00 Accepting Medicaid  [place] ([address]) [facility] [address], [place], [place] [address] [phone] Mon-Fri 8:00-5:00 Accepting Medicaid   [place] ([address]) [facility] [place] [address], [place], [place] [address] [phone] Mon-Fri 8:00-5:00 [facility] [place] [address], [place], [place] [address] [phone] Mon-Fri 8:00-5:00 [facility] [place] [address] [place] [address], [place], [place] [address] [phone] Mon-Fri 8:00-5:00 After hours clinic ([address] [place], [place] [address]) [phone] Mon-Fri 5:00-8:00, Sat 12:00-6:00, Sun 10:00-4:00 Accepting Medicaid [facility] [place]   [address], [place], [place]  [address] [phone]   Fax - [phone]  [place] ([address]) [facility] [place] [facility] [address], [place], [place] [address] [phone] Mon-Fri 8:00-5:00 [facility] [place] ([facility] [place]) [address], [place],  [address] [phone] Mon-Thur 8:00-7:00, Fri 8:00-5:00, Sat 8:00-12:00    

## 2021-07-29 ENCOUNTER — Ambulatory Visit: Payer: Medicaid Other | Admitting: Student

## 2021-07-29 ENCOUNTER — Other Ambulatory Visit (HOSPITAL_COMMUNITY)
Admission: RE | Admit: 2021-07-29 | Discharge: 2021-07-29 | Disposition: A | Discharge status: Home / Self Care | Payer: Medicaid Other | Source: Ambulatory Visit | Attending: Student | Admitting: Student

## 2021-07-29 ENCOUNTER — Other Ambulatory Visit: Payer: Self-pay

## 2021-07-29 VITALS — BP 133/66 | HR 95 | Wt 256.6 lb

## 2021-07-29 DIAGNOSIS — Z5941 Food insecurity: Secondary | ICD-10-CM

## 2021-07-29 DIAGNOSIS — Z3202 Encounter for pregnancy test, result negative: Secondary | ICD-10-CM

## 2021-07-29 DIAGNOSIS — Z0000 Encounter for general adult medical examination without abnormal findings: Secondary | ICD-10-CM | POA: Insufficient documentation

## 2021-07-29 DIAGNOSIS — Z30017 Encounter for initial prescription of implantable subdermal contraceptive: Secondary | ICD-10-CM | POA: Diagnosis not present

## 2021-07-29 DIAGNOSIS — Z113 Encounter for screening for infections with a predominantly sexual mode of transmission: Secondary | ICD-10-CM | POA: Insufficient documentation

## 2021-07-29 LAB — POCT PREGNANCY, URINE: Preg Test, Ur: NEGATIVE

## 2021-07-29 MED ORDER — ETONOGESTREL 68 MG ~~LOC~~ IMPL
68.0000 mg | DRUG_IMPLANT | Freq: Once | SUBCUTANEOUS | Status: AC
  Administered 2021-07-29: 68 mg via SUBCUTANEOUS

## 2021-07-29 NOTE — Progress Notes (Signed)
°  History:  Joyce Barnes is a 20 y.o. G1P1001 who presents to clinic today for pap and nexplanon insertion. Baby was born 66-25 and she reports that she has been feeling like she has a lot of discharge since then.   The following portions of the patient's history were reviewed and updated as appropriate: allergies, current medications, family history, past medical history, social history, past surgical history and problem list.  Review of Systems:  Review of Systems  Constitutional: Negative.   HENT: Negative.    Cardiovascular: Negative.   Genitourinary: Negative.   Musculoskeletal: Negative.   Skin: Negative.   Neurological: Negative.   Psychiatric/Behavioral: Negative.       Objective:  Physical Exam BP 133/66    Pulse 95    Wt 256 lb 9.6 oz (116.4 kg)    Breastfeeding No    BMI 41.42 kg/m  Physical Exam Constitutional:      Appearance: Normal appearance.  HENT:     Head: Normocephalic.  Pulmonary:     Effort: Pulmonary effort is normal.  Abdominal:     General: Abdomen is flat.  Genitourinary:    General: Normal vulva.  Musculoskeletal:        General: Normal range of motion.  Skin:    General: Skin is warm and dry.  Neurological:     General: No focal deficit present.     Mental Status: She is alert.      Labs and Imaging Results for orders placed or performed in visit on 07/29/21 (from the past 24 hour(s))  Pregnancy, urine POC     Status: None   Collection Time: 07/29/21  2:42 PM  Result Value Ref Range   Preg Test, Ur NEGATIVE NEGATIVE    No results found.  Health Maintenance Due  Topic Date Due   Hepatitis C Screening  Never done   PAP-Cervical Cytology Screening  Never done   PAP SMEAR-Modifier  Never done     Assessment & Plan:  1. Food insecurity   1. Food insecurity   2. Encounter for insertion of subdermal contraceptive     - AMBULATORY REFERRAL TO [facility] - Cytology - PAP( [place]) - Cervicovaginal  ancillary only( [place]) -will also do wet prep today Approximately 30 minutes of total time was spent with this patient on exam and procedure.   Marylene Land, CNM 07/29/2021 3:39 PM    NEXPLANON INSERTION: Appropriate time out taken. Nexlanon site (left arm) identified and the area was prepped in usual sterile fashon. 2 cc of 1% lidocaine was used to anesthetize the area starting with the distal end.   Next, the area was cleansed with betadine and the Nexplanon was inserted without difficulty.  Pressure bandage was applied.  Pt was instructed to remove pressure bandage in a few hours, and keep insertion site covered with a bandaid for 3 days.

## 2021-07-31 LAB — CERVICOVAGINAL ANCILLARY ONLY
Bacterial Vaginitis (gardnerella): POSITIVE — AB
Candida Glabrata: NEGATIVE
Candida Vaginitis: NEGATIVE
Comment: NEGATIVE
Comment: NEGATIVE
Comment: NEGATIVE

## 2021-08-01 LAB — CYTOLOGY - PAP
Chlamydia: NEGATIVE
Comment: NEGATIVE
Comment: NEGATIVE
Comment: NORMAL
Diagnosis: NEGATIVE
Neisseria Gonorrhea: NEGATIVE
Trichomonas: NEGATIVE

## 2021-08-14 ENCOUNTER — Encounter: Payer: Self-pay | Admitting: *Deleted

## 2021-08-14 ENCOUNTER — Other Ambulatory Visit: Payer: Self-pay | Admitting: *Deleted

## 2021-08-14 DIAGNOSIS — B9689 Other specified bacterial agents as the cause of diseases classified elsewhere: Secondary | ICD-10-CM

## 2021-08-14 MED ORDER — METRONIDAZOLE 500 MG PO TABS: 500.0000 mg | ORAL_TABLET | Freq: Two times a day (BID) | ORAL | 0 refills | Status: DC

## 2021-12-14 IMAGING — US US OB COMP LESS 14 WK
1 series · 15 of 28 positions shown · non-contrast
Comparison: None.

CLINICAL DATA: Abdominal pain and vaginal discharge.

EXAM:
OBSTETRIC <14 WK US AND TRANSVAGINAL OB US
TECHNIQUE: Both transabdominal and transvaginal ultrasound examinations were
performed for complete evaluation of the gestation as well as the
maternal uterus, adnexal regions, and pelvic cul-de-sac.
Transvaginal technique was performed to assess early pregnancy.

[Series 1: us ob comp less 14 wk · 15 of 47 slices shown]
[im 1/47]
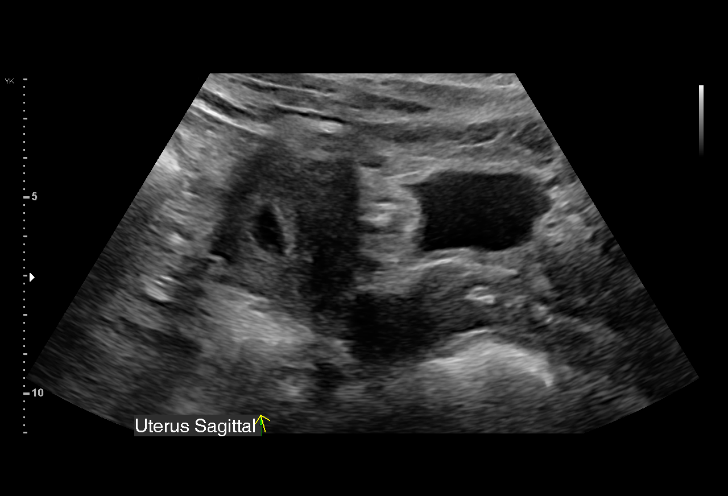
[im 4/47]
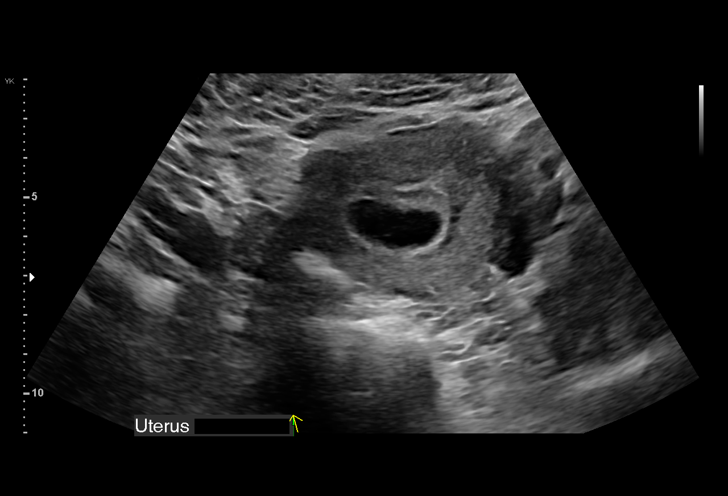
[im 7/47]
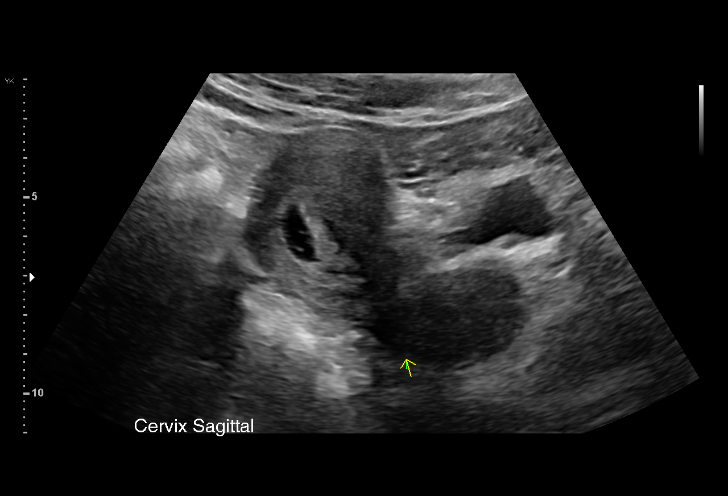
[im 11/47]
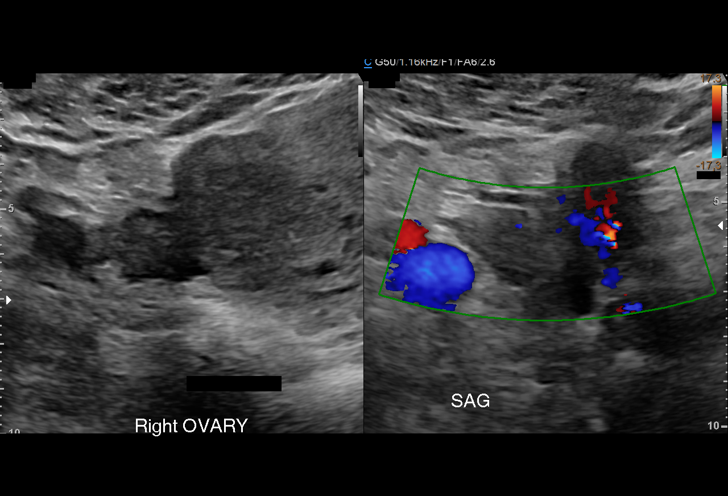
[im 14/47]
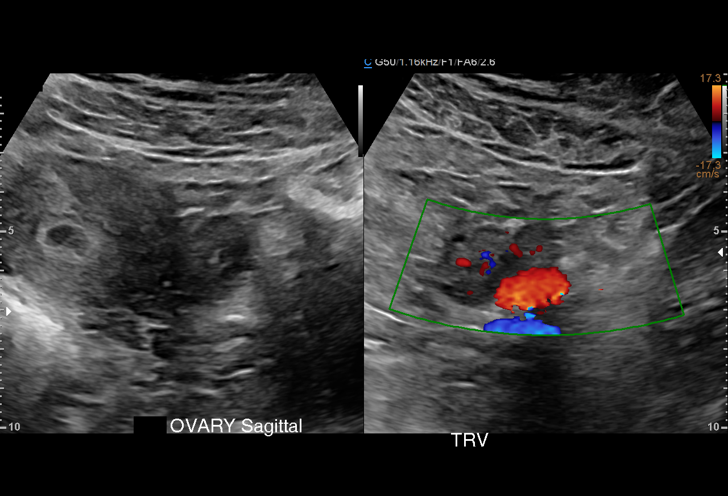
[im 18/47]
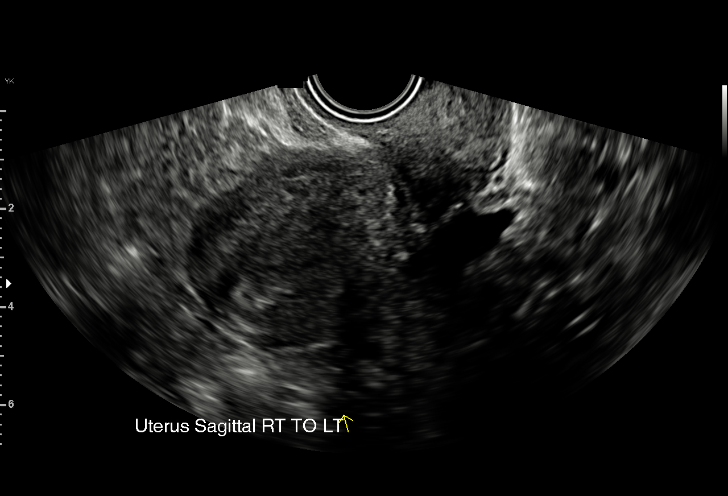
[im 21/47]
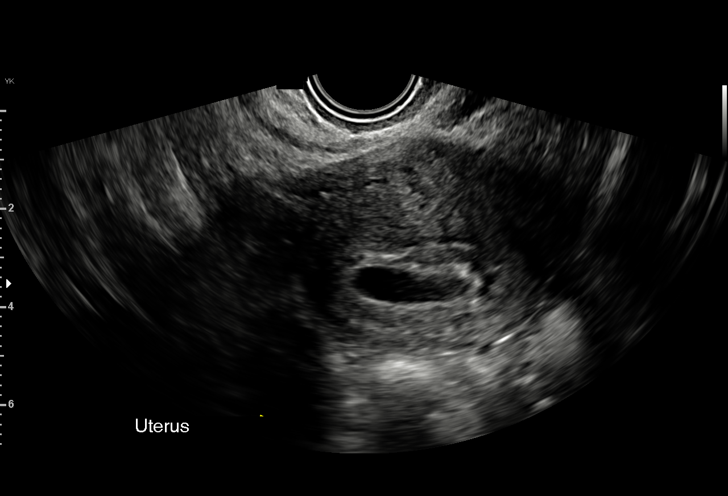
[im 24/47]
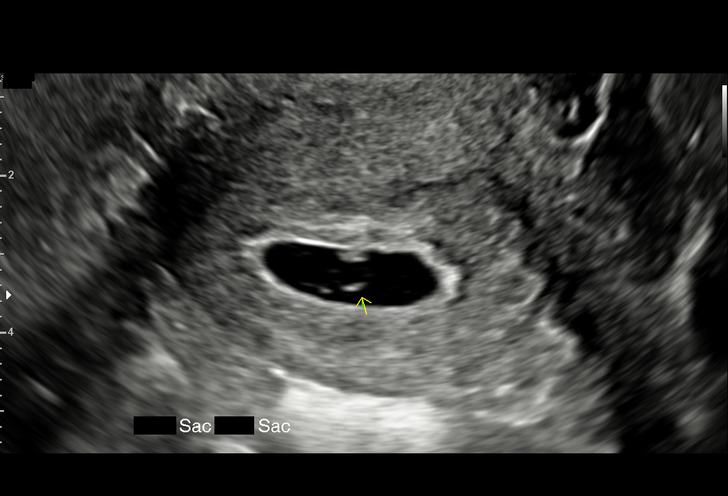
[im 26/47]
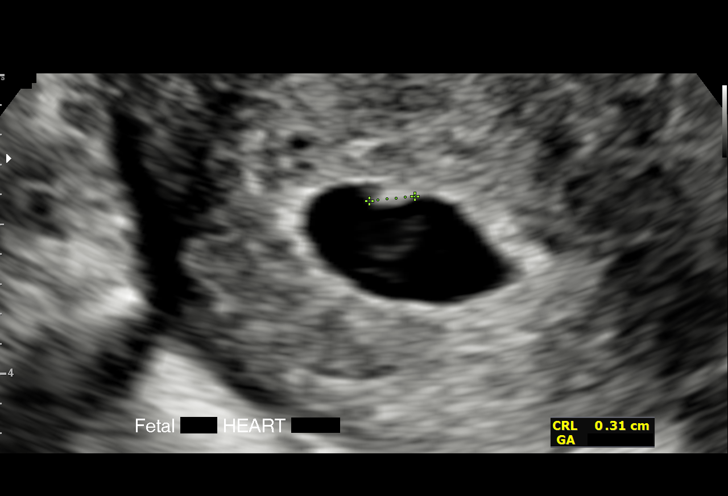
[im 29/47]
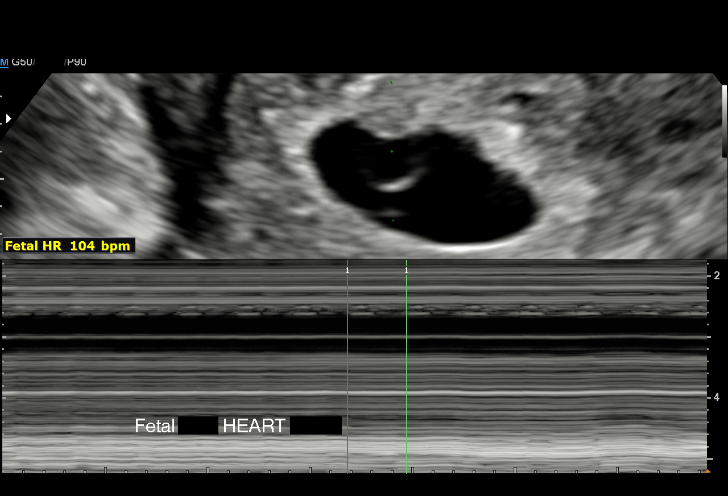
[im 33/47]
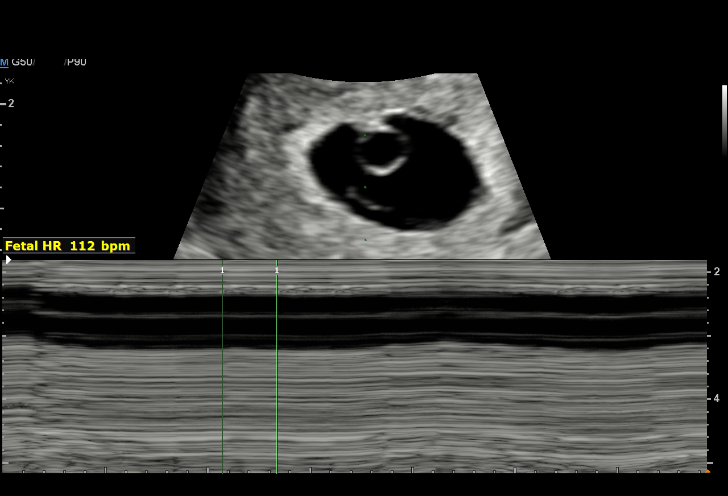
[im 36/47]
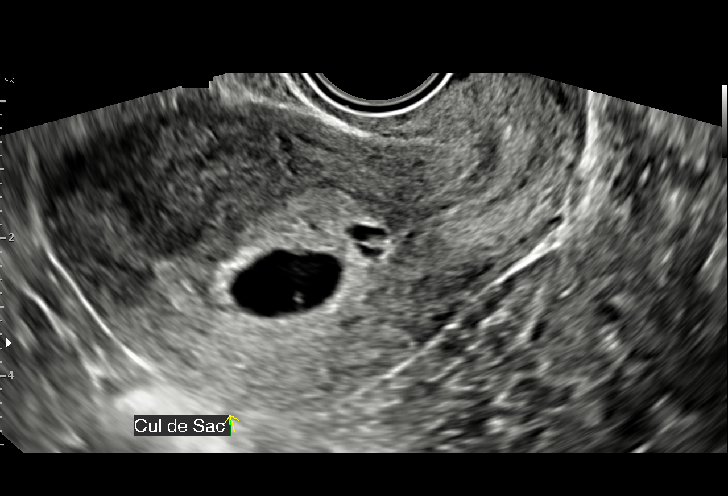
[im 40/47]
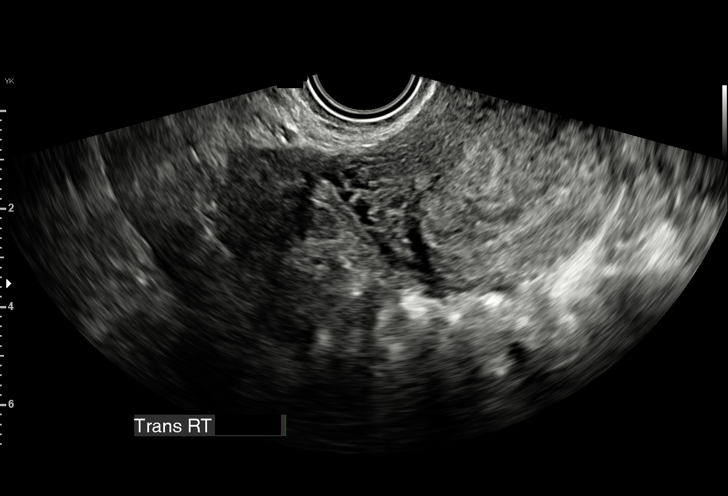
[im 43/47]
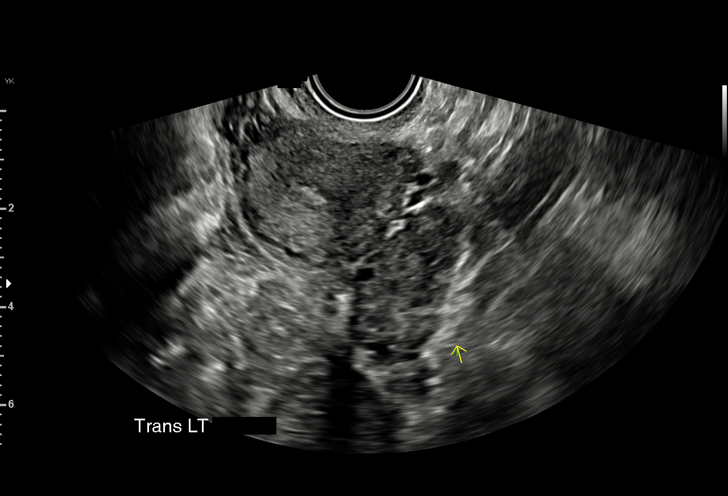
[im 47/47]
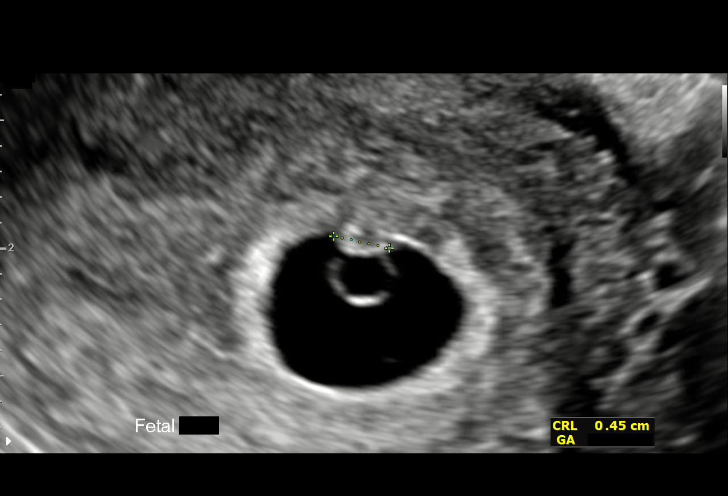

[15 of 28 positions shown; findings below may reference images not displayed]

FINDINGS: Intrauterine gestational sac: Single

Yolk sac:  Visualized.

Embryo:  Visualized.

Cardiac Activity: Visualized.

Heart Rate: 106 bpm

CRL: 3.9 mm   6 w   0 d                  US EDC: 05/17/2021

Subchorionic hemorrhage: Small measuring 6 mm in greatest dimension.

Maternal uterus/adnexae: Normal.
IMPRESSION: Single live intrauterine pregnancy corresponding to 6 weeks and 0
days gestation.

Small subchorionic hemorrhage.

## 2023-03-24 DIAGNOSIS — E559 Vitamin D deficiency, unspecified: Secondary | ICD-10-CM | POA: Diagnosis not present

## 2023-03-24 DIAGNOSIS — I10 Essential (primary) hypertension: Secondary | ICD-10-CM | POA: Diagnosis not present

## 2023-03-24 DIAGNOSIS — R5383 Other fatigue: Secondary | ICD-10-CM | POA: Diagnosis not present

## 2023-03-29 DIAGNOSIS — Z6841 Body Mass Index (BMI) 40.0 and over, adult: Secondary | ICD-10-CM | POA: Diagnosis not present

## 2023-06-03 DIAGNOSIS — E559 Vitamin D deficiency, unspecified: Secondary | ICD-10-CM | POA: Diagnosis not present

## 2023-06-03 DIAGNOSIS — I10 Essential (primary) hypertension: Secondary | ICD-10-CM | POA: Diagnosis not present

## 2023-06-24 NOTE — Progress Notes (Signed)
 Bariatric checklist: Visit 2 of 3 ([employer]) BMI: 50.63 Weight history documented? N/A [employer] patient? No Seminar attendance date: 03/05/23 Nutrition visits: 03/29/23 not cleared, 06/15/23 cleared Psych eval: 07/14/23 Needs  Referred for exercise eval: 06/14/23 CPE (or NHBS eval): 03/24/23 MS EKG: N/A Labs: TSH (Normal 03/24/23), CBC & CMP 03/24/23 Endoscopy: N/A Currently smoking: Never Nicotine level: N/A CXR (if a history of smoking): N/A Is sleep study needed/ordered: N/A Additional clearances requested: N/A Surgical Consult: 03/17/23 JD SG   PCP medical clearance letter: Needs Packet complete: INN class (Pt cx 3 times), Needs   Liver Shrinking Diet:  2 weeks   Visits: 1) 03/24/23 MS  2) 06/03/23 MS 3) Needs  JD:  Needs after all clearances are completed

## 2024-04-19 ENCOUNTER — Ambulatory Visit: Admitting: Obstetrics and Gynecology

## 2024-04-19 ENCOUNTER — Other Ambulatory Visit: Payer: Self-pay

## 2024-04-19 ENCOUNTER — Encounter: Payer: Self-pay | Admitting: Obstetrics and Gynecology

## 2024-04-19 VITALS — BP 136/87 | HR 99 | Wt 276.3 lb

## 2024-04-19 DIAGNOSIS — Z3046 Encounter for surveillance of implantable subdermal contraceptive: Secondary | ICD-10-CM

## 2024-04-19 DIAGNOSIS — Z3009 Encounter for other general counseling and advice on contraception: Secondary | ICD-10-CM

## 2024-04-19 DIAGNOSIS — Z975 Presence of (intrauterine) contraceptive device: Secondary | ICD-10-CM

## 2024-04-19 MED ORDER — NORETHIN ACE-ETH ESTRAD-FE 1-20 MG-MCG(24) PO TABS: 1.0000 | ORAL_TABLET | Freq: Every day | ORAL | 3 refills | Status: DC

## 2024-04-19 NOTE — Progress Notes (Signed)
     GYNECOLOGY OFFICE PROCEDURE NOTE  Joyce Barnes is a 23 y.o. G1P1001 here for Nexplanon  removal.  Last pap smear was on 12/22 and was normal.  No other gynecologic concerns.   Nexplanon  Removal Patient identified, informed consent performed, consent signed.   Appropriate time out taken. Nexplanon  site identified.  Area prepped in usual sterile fashon. One ml of 1% lidocaine  was used to anesthetize the area at the distal end of the implant. A small stab incision was made right beside the implant on the distal portion.  The Nexplanon  rod was grasped using hemostats and removed without difficulty.  There was minimal blood loss. There were no complications.  3 ml of 1% lidocaine  was injected around the incision for post-procedure analgesia.  Steri-strips were applied over the small incision.  A pressure bandage was applied to reduce any bruising.  The patient tolerated the procedure well and was given post procedure instructions.  Patient is planning to use OCPs for contraception.  Due to borderline BP advise bp check in 6 weeks. Pt to schedule for annual exam and pap in 3 months.    Jerilynn Buddle, MD, FACOG Obstetrician & Gynecologist, [facility] for [facility], [facility]

## 2024-04-29 ENCOUNTER — Emergency Department (HOSPITAL_COMMUNITY)

## 2024-04-29 ENCOUNTER — Emergency Department (HOSPITAL_COMMUNITY)
Admission: EM | Admit: 2024-04-29 | Discharge: 2024-04-29 | Disposition: A | Discharge status: Home / Self Care | Attending: Emergency Medicine | Admitting: Emergency Medicine

## 2024-04-29 ENCOUNTER — Encounter (HOSPITAL_COMMUNITY): Payer: Self-pay | Admitting: *Deleted

## 2024-04-29 ENCOUNTER — Other Ambulatory Visit: Payer: Self-pay

## 2024-04-29 DIAGNOSIS — D751 Secondary polycythemia: Secondary | ICD-10-CM | POA: Insufficient documentation

## 2024-04-29 DIAGNOSIS — R002 Palpitations: Secondary | ICD-10-CM | POA: Insufficient documentation

## 2024-04-29 LAB — RESP PANEL BY RT-PCR (RSV, FLU A&B, COVID)  RVPGX2
Influenza A by PCR: NEGATIVE
Influenza B by PCR: NEGATIVE
Resp Syncytial Virus by PCR: NEGATIVE
SARS Coronavirus 2 by RT PCR: NEGATIVE

## 2024-04-29 LAB — CBC WITH DIFFERENTIAL/PLATELET
Abs Immature Granulocytes: 0.03 K/uL (ref 0.00–0.07)
Basophils Absolute: 0 K/uL (ref 0.0–0.1)
Basophils Relative: 1 %
Eosinophils Absolute: 0.1 K/uL (ref 0.0–0.5)
Eosinophils Relative: 1 %
HCT: 44 % (ref 36.0–46.0)
Hemoglobin: 15.4 g/dL — ABNORMAL HIGH (ref 12.0–15.0)
Immature Granulocytes: 0 %
Lymphocytes Relative: 30 %
Lymphs Abs: 2.4 K/uL (ref 0.7–4.0)
MCH: 29.7 pg (ref 26.0–34.0)
MCHC: 35 g/dL (ref 30.0–36.0)
MCV: 84.9 fL (ref 80.0–100.0)
Monocytes Absolute: 0.3 K/uL (ref 0.1–1.0)
Monocytes Relative: 4 %
Neutro Abs: 5.3 K/uL (ref 1.7–7.7)
Neutrophils Relative %: 64 %
Platelets: 310 K/uL (ref 150–400)
RBC: 5.18 MIL/uL — ABNORMAL HIGH (ref 3.87–5.11)
RDW: 11.8 % (ref 11.5–15.5)
WBC: 8.2 K/uL (ref 4.0–10.5)
nRBC: 0 % (ref 0.0–0.2)

## 2024-04-29 LAB — COMPREHENSIVE METABOLIC PANEL WITH GFR
ALT: 56 U/L — ABNORMAL HIGH (ref 0–44)
AST: 37 U/L (ref 15–41)
Albumin: 4 g/dL (ref 3.5–5.0)
Alkaline Phosphatase: 79 U/L (ref 38–126)
Anion gap: 11 (ref 5–15)
BUN: 9 mg/dL (ref 6–20)
CO2: 21 mmol/L — ABNORMAL LOW (ref 22–32)
Calcium: 9.2 mg/dL (ref 8.9–10.3)
Chloride: 103 mmol/L (ref 98–111)
Creatinine, Ser: 0.61 mg/dL (ref 0.44–1.00)
GFR, Estimated: >60 mL/min (ref >60)
Glucose, Bld: 107 mg/dL — ABNORMAL HIGH (ref 70–99)
Potassium: 4.1 mmol/L (ref 3.5–5.1)
Sodium: 135 mmol/L (ref 135–145)
Total Bilirubin: 0.9 mg/dL (ref 0.0–1.2)
Total Protein: 7.1 g/dL (ref 6.5–8.1)

## 2024-04-29 LAB — MAGNESIUM: Magnesium: 1.9 mg/dL (ref 1.7–2.4)

## 2024-04-29 LAB — TROPONIN I (HIGH SENSITIVITY): Troponin I (High Sensitivity): 4 ng/L (ref <18)

## 2024-04-29 LAB — HCG, SERUM, QUALITATIVE: Preg, Serum: NEGATIVE

## 2024-04-29 NOTE — ED Triage Notes (Signed)
 Patient presents to ed via [facility] states she was c/o palpitations states ems came out to the house earlier today and was checked out and decided not to come to the ED , however she was able to sleep some and then when she woke up this am was still having palpitations, denies chest pain.

## 2024-04-29 NOTE — ED Provider Notes (Signed)
 [place] EMERGENCY DEPARTMENT AT [facility] Provider Note   CSN: [id number] Arrival date & time: 04/29/24  1111     Patient presents with: papitations  HPI Joyce Barnes is a 23 y.o. female presenting for palpitations.  Stated it started late last night.  Initially called EMS to her house and then decided not to come to the ED and felt better.  Went back to bed and woke up with the same sensation.  Denies chest pain shortness of breath.  At this time she has no symptoms.  Denies OCP use, calf tenderness or swelling, recent immobilization or known blood clots.   HPI     Prior to Admission medications   Medication Sig Start Date End Date Taking? Authorizing Provider  Norethindrone Acetate-Ethinyl Estrad-FE (LOESTRIN 24 FE) 1-20 MG-MCG(24) tablet Take 1 tablet by mouth daily. 04/19/24   Zina Jerilynn LABOR, MD    Allergies: Patient has no known allergies.    Review of Systems See HPI  Updated Vital Signs BP (!) 148/85   Pulse (!) 106   Temp 98.8 F (37.1 C) (Oral)   Resp 18   Ht 5' 6 (1.676 m)   Wt 125.2 kg   LMP 02/19/2024 (Exact Date)   SpO2 100%   BMI 44.55 kg/m   Physical Exam Vitals and nursing note reviewed.  HENT:     Head: Normocephalic and atraumatic.     Mouth/Throat:     Mouth: Mucous membranes are moist.  Eyes:     General:        Right eye: No discharge.        Left eye: No discharge.     Conjunctiva/sclera: Conjunctivae normal.  Cardiovascular:     Rate and Rhythm: Normal rate and regular rhythm.     Pulses: Normal pulses.     Heart sounds: Normal heart sounds.  Pulmonary:     Effort: Pulmonary effort is normal.     Breath sounds: Normal breath sounds and air entry.  Abdominal:     General: Abdomen is flat.     Palpations: Abdomen is soft.  Skin:    General: Skin is warm and dry.  Neurological:     General: No focal deficit present.  Psychiatric:        Mood and Affect: Mood normal.     (all labs ordered are listed, but  only abnormal results are displayed) Labs Reviewed  COMPREHENSIVE METABOLIC PANEL WITH GFR - Abnormal; Notable for the following components:      Result Value   CO2 21 (*)    Glucose, Bld 107 (*)    ALT 56 (*)    All other components within normal limits  CBC WITH DIFFERENTIAL/PLATELET - Abnormal; Notable for the following components:   RBC 5.18 (*)    Hemoglobin 15.4 (*)    All other components within normal limits  RESP PANEL BY RT-PCR (RSV, FLU A&B, COVID)  RVPGX2  HCG, SERUM, QUALITATIVE  MAGNESIUM  TROPONIN I (HIGH SENSITIVITY)    EKG: EKG Interpretation Date/Time:  Saturday April 29 2024 11:22:07 EDT Ventricular Rate:  114 PR Interval:  144 QRS Duration:  78 QT Interval:  330 QTC Calculation: 454 R Axis:   84  Text Interpretation: Sinus tachycardia Otherwise normal ECG No previous ECGs available Confirmed by Freddi Hamilton [phone]) on 04/29/2024 11:48:21 AM  Radiology: [facility] Chest 2 View Result Date: 04/29/2024 EXAM: 2 VIEW(S) XRAY OF THE CHEST 04/29/2024 12:43:00 PM COMPARISON: None available. CLINICAL HISTORY: Palpitations.  Patient presents to ED via [facility] states she was c/o palpitations states EMS came out to the house earlier today and was checked out and decided not to come to the ED, however she was able to sleep some and then when she woke up this AM was still having palpitations, denies chest pain. FINDINGS: LUNGS AND PLEURA: No focal pulmonary opacity. No pulmonary edema. No pleural effusion. No pneumothorax. HEART AND MEDIASTINUM: No acute abnormality of the cardiac and mediastinal silhouettes. BONES AND SOFT TISSUES: No acute osseous abnormality. IMPRESSION: 1. No acute process. Electronically signed by: Waddell Calk MD 04/29/2024 01:08 PM EDT RP Workstation: [id number]     Procedures   Medications Ordered in the ED - No data to display                                  Medical Decision Making  Initial Impression and Ddx 23 year old well-appearing female  presenting for palpitations.  Exam is unremarkable.  DDx includes arrhythmia, electrolyte derangement, PE, other. Patient PMH that increases complexity of ED encounter:  none  Interpretation of Diagnostics - I independent reviewed and interpreted the labs as followed: mild polycythemia  - I independently visualized the following imaging with scope of interpretation limited to determining acute life threatening conditions related to emergency care: CXR, which revealed no acute process  - I personally reviewed interpreted EKG which revealed sinus tachycardia  Patient Reassessment and Ultimate Disposition/Management On reassessment, heart rate had normalized without intervention.  Work appears unremarkable.  Labs showed that she was mildly polycythemic and she mention that she has not been hydrating normally.  Suspect her tachycardia is likely more related to mild dehydration and anxiety.  Considered PE but unlikely given no chest pain and lack of risk factors with negative troponins and reassuring EKG.  Advised her to follow-up with PCP.  Discussed return precautions.  Discharged.  Patient management required discussion with the following services or consulting groups:  None  Complexity of Problems Addressed Acute complicated illness or Injury  Additional Data Reviewed and Analyzed Further history obtained from: Past medical history and medications listed in the EMR and Prior ED visit notes  Patient Encounter Risk Assessment Consideration of hospitalization      Final diagnoses:  Palpitations    ED Discharge Orders     None          Lang Norleen POUR, PA-C 04/29/24 1436    Cottie Donnice PARAS, MD 04/29/24 1441

## 2024-04-29 NOTE — ED Provider Triage Note (Signed)
 Emergency Medicine Provider Triage Evaluation Note  Joyce Barnes , a 23 y.o. female  was evaluated in triage.  Pt complains of palpitations starting this morning.  No chest pain.  Limited history due to language barrier in triage.  Review of Systems  Positive: Palpitations Negative: Chest pain  Physical Exam  BP (!) 144/99 (BP Location: Right Arm)   Pulse (!) 127   Temp 99.4 F (37.4 C) (Oral)   Resp 16   Ht 5' 6 (1.676 m)   Wt 125.2 kg   LMP 02/19/2024 (Exact Date)   SpO2 100%   BMI 44.55 kg/m  Gen:   Awake, no distress  Resp:  Normal effort, clear lungs Cardiac: Tachycardic  Medical Decision Making  Medically screening exam initiated at 11:54 AM.  Appropriate orders placed.  Aariana Shelsey Rieth was informed that the remainder of the evaluation will be completed by another provider, this initial triage assessment does not replace that evaluation, and the importance of remaining in the ED until their evaluation is complete.  No current chest pain, will get labs and chest x-ray and ECG.   Freddi Hamilton, MD 04/29/24 1155

## 2024-04-29 NOTE — ED Notes (Signed)
 Patient discharged by this RN. Patient ambulatory to lobby at time of discharge. Patient expresses no additional concerns or questions for RN.

## 2024-04-29 NOTE — Discharge Instructions (Addendum)
 Evaluation today was reassuring.  As we discussed please follow-up with primary care provider.  Their contact information is your discharge summary.  If you have shortness of breath, chest pain or any other concerning symptom please return to the ED for further evaluation.

## 2024-05-03 ENCOUNTER — Ambulatory Visit: Payer: Self-pay

## 2024-05-03 NOTE — Telephone Encounter (Signed)
 FYI Only or Action Required?: FYI only for provider.  Patient was last seen in primary care on unknown.  Called Nurse Triage reporting Palpitations.  Symptoms began several days ago.  Interventions attempted: Nothing.  Symptoms are: gradually worsening.  Triage Disposition: See HCP Within 4 Hours (Or PCP Triage)  Patient/caregiver understands and will follow disposition?: Unsure            Copied from CRM [id number]. Topic: Clinical - Red Word Triage >> May 03, 2024  1:06 PM Montie POUR wrote: Red Word that prompted transfer to Nurse Triage:  She went to the emergency room on 04/29/24 with heart palpitations and last night she had them again Reason for Disposition  [1] Heart beating very rapidly (e.g., > 140 / minute) AND [2] NOT present now  (Exception: Only during exercise.)  Answer Assessment - Initial Assessment Questions This RN used an interpreter for the call ID: [id number]. Patient states she had heart palpitations last night. Patient states it happens when sleeping and has happened 3 times. She states she wakes up suddenly and has difficulty breathing. Patient denies any symptoms currently but was urged to return to ED for symptoms. Patient established an appointment with PCP.     1. DESCRIPTION: Please describe your heart rate or heartbeat that you are having (e.g., fast/slow, regular/irregular, skipped or extra beats, palpitations)     She states it feels like her heart its beating out of her chest  2. ONSET: When did it start? (e.g., minutes, hours, days)      04/29/2024 3. DURATION: How long does it last (e.g., seconds, minutes, hours)     Minutes  4. PATTERN Does it come and go, or has it been constant since it started?  Does it get worse with exertion?   Are you feeling it now?     Comes and goes  5. RECURRENT SYMPTOM: Have you ever had this before? If Yes, ask: When was the last time? and What happened that time?      Denies  6. CARDIAC  HISTORY: Do you have any history of heart disease? (e.g., heart attack, angina, bypass surgery, angioplasty, arrhythmia)      Denies cardiac history.  7. OTHER SYMPTOMS: Do you have any other symptoms? (e.g., dizziness, chest pain, sweating, difficulty breathing)       Feels like a pressing feeling into chest when symptoms occur.  Protocols used: Heart Rate and Heartbeat Questions-A-AH

## 2024-05-13 ENCOUNTER — Emergency Department (HOSPITAL_COMMUNITY)
Admission: EM | Admit: 2024-05-13 | Discharge: 2024-05-13 | Disposition: A | Discharge status: Home / Self Care | Attending: Emergency Medicine | Admitting: Emergency Medicine

## 2024-05-13 ENCOUNTER — Encounter (HOSPITAL_COMMUNITY): Payer: Self-pay | Admitting: Pharmacy Technician

## 2024-05-13 ENCOUNTER — Other Ambulatory Visit: Payer: Self-pay

## 2024-05-13 DIAGNOSIS — R0789 Other chest pain: Secondary | ICD-10-CM | POA: Diagnosis not present

## 2024-05-13 DIAGNOSIS — H9203 Otalgia, bilateral: Secondary | ICD-10-CM | POA: Diagnosis not present

## 2024-05-13 NOTE — ED Triage Notes (Signed)
 PT c/o bil ear pain as if someone is stabbing a knife into them.  Also c/o feeling a bump on the top of her head.  Denies fevers, sore throat, nasal drainage or cough.

## 2024-05-13 NOTE — ED Provider Notes (Signed)
  EMERGENCY DEPARTMENT AT [facility] Provider Note   CSN: [id number] Arrival date & time: 05/13/24  1431     Patient presents with: Otalgia   Joyce Barnes is a 23 y.o. female presenting to ED with several concerns.  The patient where she had a Nexplanon  removed about 2 weeks ago.  She was seen in the ED at that time for palpitations, and had an unremarkable workup and was discharged.  She reports since then she has noted a small and slightly tender nodule on the top of her head.  She has had a bitemporal headache.  She reports that she has a strange sensation in her ears, possible ringing and a sharp feeling intermittently.  She also has a burning sensation in her right breast.  She denies any other medical conditions.  She does have a new PCP appointment scheduled in December and has establish care locally.  She sees OB/GYN for her Nexplanon .  She denies any rashes.   HPI     Prior to Admission medications   Medication Sig Start Date End Date Taking? Authorizing Provider  Norethindrone Acetate-Ethinyl Estrad-FE (LOESTRIN 24 FE) 1-20 MG-MCG(24) tablet Take 1 tablet by mouth daily. 04/19/24   Zina Jerilynn LABOR, MD    Allergies: Patient has no known allergies.    Review of Systems  Updated Vital Signs BP (!) 160/96 (BP Location: Right Arm)   Pulse 100   Temp 99 F (37.2 C)   Resp 17   LMP 02/19/2024 (Exact Date)   SpO2 100%   Physical Exam Constitutional:      General: She is not in acute distress. HENT:     Head: Normocephalic and atraumatic.     Comments: Small tender nodule on the crown of the head    Right Ear: Tympanic membrane, ear canal and external ear normal. There is no impacted cerumen.     Left Ear: Tympanic membrane, ear canal and external ear normal. There is no impacted cerumen.  Eyes:     Conjunctiva/sclera: Conjunctivae normal.     Pupils: Pupils are equal, round, and reactive to light.  Cardiovascular:     Rate and Rhythm:  Normal rate and regular rhythm.  Pulmonary:     Effort: Pulmonary effort is normal. No respiratory distress.  Abdominal:     General: There is no distension.     Tenderness: There is no abdominal tenderness.  Musculoskeletal:        General: No swelling or tenderness.  Skin:    General: Skin is warm and dry.  Neurological:     General: No focal deficit present.     Mental Status: She is alert. Mental status is at baseline.  Psychiatric:        Mood and Affect: Mood normal.        Behavior: Behavior normal.     (all labs ordered are listed, but only abnormal results are displayed) Labs Reviewed - No data to display  EKG: None  Radiology: No results found.   Procedures   Medications Ordered in the ED - No data to display                                  Medical Decision Making  There is an unclear and broad differential for the patient's symptoms.  This may be a viral syndrome.  It may be related to hormonal changes with Nexplanon  removal.  I do not see evidence of shingles at this time but we discussed the possibility that pain can sometimes precede her rash and the patient was given instructions for this if she were to develop a rash.  I do not see evidence of bacterial infection.  Do not see evidence of acute otitis media.  This does not appear consistent with trigeminal neuralgia.     Final diagnoses:  Otalgia of both ears  Chest wall pain    ED Discharge Orders     None          Cottie Donnice PARAS, MD 05/13/24 1520

## 2024-05-13 NOTE — ED Provider Notes (Signed)
 Of note, a Spanish translator Sula was used for my full history and exam   Cottie Donnice PARAS, MD 05/13/24 1520

## 2024-05-22 ENCOUNTER — Encounter: Payer: Self-pay | Admitting: Family Medicine

## 2024-05-22 ENCOUNTER — Ambulatory Visit: Payer: Self-pay

## 2024-05-22 ENCOUNTER — Ambulatory Visit: Admitting: Family Medicine

## 2024-05-22 ENCOUNTER — Other Ambulatory Visit: Payer: Self-pay

## 2024-05-22 VITALS — BP 139/78 | HR 96 | Wt 270.1 lb

## 2024-05-22 DIAGNOSIS — R002 Palpitations: Secondary | ICD-10-CM

## 2024-05-22 DIAGNOSIS — F419 Anxiety disorder, unspecified: Secondary | ICD-10-CM | POA: Diagnosis not present

## 2024-05-22 DIAGNOSIS — I10 Essential (primary) hypertension: Secondary | ICD-10-CM

## 2024-05-22 DIAGNOSIS — Z758 Other problems related to medical facilities and other health care: Secondary | ICD-10-CM | POA: Diagnosis not present

## 2024-05-22 DIAGNOSIS — Z603 Acculturation difficulty: Secondary | ICD-10-CM

## 2024-05-22 MED ORDER — HYDROXYZINE HCL 10 MG PO TABS
10.0000 mg | ORAL_TABLET | Freq: Three times a day (TID) | ORAL | 0 refills | Status: AC | PRN
Start: 2024-05-22 — End: ?

## 2024-05-22 NOTE — Assessment & Plan Note (Addendum)
 Care was provided directly by bilingual staff and/or with the help of an interpreter.

## 2024-05-22 NOTE — Assessment & Plan Note (Signed)
 Hx gHTN on Procardia  and labetalol  presenting with BP 142/95 and 139/78. Consider prescribing antihypertensive at PCP 08/02/24.

## 2024-05-22 NOTE — Progress Notes (Addendum)
 Spanish interpreter: in person used Subjective:    Patient ID: Joyce Barnes is a 23 y.o. female presenting with No chief complaint on file.  on 05/22/2024  HPI: Patient endorses 4 episodes of racing heart like it was going to jump out of [her] chest, shortness of breath, sweating, racing thoughts, and worries about bad things/negative health outcomes. These episodes have lasted 5 to 30 minutes each. She reports feeling a warning that an event will come on but that there are no particular triggers or stressors provoking these events. She has sought EMS/ED/UC treatment, and EKGs and labs have come back normal. She says that her mother has suggested that these could be due to anxiety, but she has sought treatment to be safe. These events used to interfere with sleep, but patient reports better sleep since her mother moved in.  Of note, patient is concerned that these symptoms began after she had Nexplanon  removed on 04/19/2024 with Loestrin prescribed, but she never filled the prescription or began taking the medication. She is not interested in contraception at this time. She has yet to resume her menses, which she reports have historically been monthly, normal in flow, and painless. She denies major stressors or changes in her life, denies substance use (beyond remote history of social alcohol use), denies history of IPV/abuse, and denies feeling unsafe at home. She denies cultural beliefs or practices affecting health/healthcare.  Review of Systems positive for headache and neck pain (relieved with Tylenol , menthol  rub, and rest), bump on head, ear pain/ringing, room spinning, molar bleeding, and foul-smelling breath. Negative for chest pain, pain radiating down arm(s), and pain with neck flexion.    Objective:    BP 139/78   Pulse 96   Wt 122.5 kg   BMI 43.60 kg/m  Physical Exam Constitutional:      General: She is not in acute distress.    Appearance: Normal appearance. She is  obese. She is not ill-appearing.  HENT:     Head: Normocephalic and atraumatic.     Comments: Patient-reported mass on scalp not appreciated on palpation. Pulmonary:     Effort: Pulmonary effort is normal. No respiratory distress.  Abdominal:     Palpations: Abdomen is soft.  Skin:    General: Skin is warm and dry.  Neurological:     Mental Status: She is alert.  Psychiatric:        Attention and Perception: Attention normal.        Mood and Affect: Mood is anxious.        Speech: Speech normal.        Behavior: Behavior is cooperative.        Thought Content: Thought content normal.     Comments: Speaks quickly, intermittently tearful.       Assessment & Plan:   Problem List Items Addressed This Visit     Language barrier, Spanish   Care was provided directly by bilingual staff and/or with the help of an interpreter.       Benign essential hypertension   Hx gHTN on Procardia  and labetalol  presenting with BP 142/95 and 139/78. Consider prescribing antihypertensive at PCP 08/02/24.      Palpitations - Primary   Patient with 4 episodes of palpitations, chest pressure, sweating, shortness of breath, and racing thoughts prompting EMS and ED evaluations with negative work-up. On the differential is hyperthyroidism, SVT, pheochromocytoma, and anxiety/panic. TSH and 24-hour urine catecholamines ordered. Consider referral to cardiology if true palpitations persist.  Relevant Orders   TSH   Catecholamines, fractionated, Urine, 24 hour   Anxiety   Patient with symptoms possibly c/w panic attacks and anxiety. Prescribing Atarax 10 mg TID PRN for anxiety, panic, and sleep.      Relevant Medications   hydrOXYzine (ATARAX) 10 MG tablet    Patient to f/u with PCP 08/02/24.  Jayson LELON Ness, Medical Student 05/22/2024 11:58 AM

## 2024-05-22 NOTE — Assessment & Plan Note (Signed)
 Patient with symptoms possibly c/w panic attacks and anxiety. Prescribing Atarax 10 mg TID PRN for anxiety, panic, and sleep.

## 2024-05-22 NOTE — Assessment & Plan Note (Addendum)
 Patient with 4 episodes of palpitations, chest pressure, sweating, shortness of breath, and racing thoughts prompting EMS and ED evaluations with negative work-up. On the differential is hyperthyroidism, SVT, pheochromocytoma, and anxiety/panic. TSH and 24-hour urine catecholamines ordered. Consider referral to cardiology if true palpitations persist.

## 2024-05-22 NOTE — Telephone Encounter (Signed)
 FYI Only or Action Required?: FYI only for provider. Needs new patient appt, High priority on the waitlist.  Patient was last seen in primary care on ED on 9/27.  Called Nurse Triage reporting Palpitations.  Symptoms began several weeks ago.  Interventions attempted: Rest, hydration, or home remedies.  Symptoms are: gradually worsening.  Triage Disposition: See PCP Within 2 Weeks  Patient/caregiver understands and will follow disposition?: Yes  Copied from CRM [id number]. Topic: Clinical - Red Word Triage >> May 22, 2024  9:27 AM Ivette P wrote: Red Word that prompted transfer to Nurse Triage:  episodes of Heart palputations and little ball or bump in head. when going to ER she is told she needs to see primary Reason for Disposition  Problems with anxiety or stress  Answer Assessment - Initial Assessment Questions On Saturday the EMS came to her house and checked her out. Her HR was calming down when they checked her so they said her rate was fine. Chest pressure but no pain, anxious, and hands shaking. This is the 4th episode is the last few weeks and every time she is assessed by EMS, or the ED, they say she is fine and tell her to FU with PCP. CAL unsuccessful for an earlier appt. Patient placed High Priority on the waitlist and will send to the office to be on watch for work in time.   Patient does not drink coffee or alcohol, or smoke. She states her stress has been increased but a lot is related to the palpitations. She is following up with OBGYN today regarding her Nexplanon  removal and if that is playing into it. Patient understands ED/UC/call EMS instructions.   1. DESCRIPTION: Please describe your heart rate or heartbeat that you are having (e.g., fast/slow, regular/irregular, skipped or extra beats, palpitations)     Feels like her heart is beating really fast like it is going to come out of her chest 2. ONSET: When did it start? (e.g., minutes, hours, days)      Has been  going on for a few weeks, 4 episodes.  3. DURATION: How long does it last (e.g., seconds, minutes, hours)     First few times it happened it was only 3-5 minutes, and then now 5-31minutes.  4. PATTERN Does it come and go, or has it been constant since it started?  Does it get worse with exertion?   Are you feeling it now?     Comes and goes. Nothing with activity.  6. HEART RATE: Can you tell me your heart rate? How many beats in 15 seconds?  Note: Not all patients can do this.       Her smart watch broke so unable to check intermittent, EMS said her HR was normal 7. RECURRENT SYMPTOM: Have you ever had this before? If Yes, ask: When was the last time? and What happened that time?      4 episodes over the last few weeks 8. CAUSE: What do you think is causing the palpitations?     Increased stress 9. CARDIAC HISTORY: Do you have any history of heart disease? (e.g., heart attack, angina, bypass surgery, angioplasty, arrhythmia)      denies 10. OTHER SYMPTOMS: Do you have any other symptoms? (e.g., dizziness, chest pain, sweating, difficulty breathing)       Some chest pressure, first episode was the strongest and felt like she was going to pass out.  11. PREGNANCY: Is there any chance you are pregnant? When was your  last menstrual period?       denies  Protocols used: Heart Rate and Heartbeat Questions-A-AH

## 2024-05-23 ENCOUNTER — Ambulatory Visit: Payer: Self-pay | Admitting: Family Medicine

## 2024-05-23 LAB — TSH: TSH: 1.31 u[IU]/mL (ref 0.450–4.500)

## 2024-05-26 DIAGNOSIS — R002 Palpitations: Secondary | ICD-10-CM | POA: Diagnosis not present

## 2024-05-31 ENCOUNTER — Telehealth: Payer: Self-pay | Admitting: Family Medicine

## 2024-05-31 ENCOUNTER — Ambulatory Visit

## 2024-05-31 ENCOUNTER — Encounter (HOSPITAL_BASED_OUTPATIENT_CLINIC_OR_DEPARTMENT_OTHER): Payer: Self-pay | Admitting: Family Medicine

## 2024-05-31 ENCOUNTER — Ambulatory Visit (HOSPITAL_BASED_OUTPATIENT_CLINIC_OR_DEPARTMENT_OTHER): Admitting: Family Medicine

## 2024-05-31 VITALS — BP 130/86 | HR 95 | Wt 265.5 lb

## 2024-05-31 DIAGNOSIS — R220 Localized swelling, mass and lump, head: Secondary | ICD-10-CM | POA: Insufficient documentation

## 2024-05-31 DIAGNOSIS — Z0000 Encounter for general adult medical examination without abnormal findings: Secondary | ICD-10-CM

## 2024-05-31 DIAGNOSIS — I10 Essential (primary) hypertension: Secondary | ICD-10-CM | POA: Diagnosis not present

## 2024-05-31 NOTE — Assessment & Plan Note (Signed)
 Blood pressure slightly elevated on initial reading, did improve on recheck.  She reports that when checking at home, readings are borderline controlled. Discussed considerations, can primarily focus on lifestyle modifications at this time given home blood pressure readings and borderline control in office today.  Ultimately, if starting back on blood pressure medication, would labetalol  or nifedipine , likely with preference for labetalol . Recommend intermittent monitoring of blood pressure at home, DASH diet

## 2024-05-31 NOTE — Assessment & Plan Note (Signed)
 Discussed considerations.  Exam without any obvious concerning features.  She ultimately would like to have referral placed to meet with dermatologist for further evaluation, can proceed with referral today.

## 2024-05-31 NOTE — Patient Instructions (Signed)
  Medication Instructions:  Your physician recommends that you continue on your current medications as directed. Please refer to the Current Medication list given to you today. --If you need a refill on any your medications before your next appointment, please call your pharmacy first. If no refills are authorized on file call the office.-- Lab Work: Your physician has recommended that you have lab work today: 1 week before next visit  If you have labs (blood work) drawn today and your tests are completely normal, you will receive your results via MyChart message OR a phone call from our staff.  Please ensure you check your voicemail in the event that you authorized detailed messages to be left on a delegated number. If you have any lab test that is abnormal or we need to change your treatment, we will call you to review the results.  Referrals/Procedures/Imaging: Dermatology   Follow-Up: Your next appointment:   Your physician recommends that you schedule a follow-up appointment in: 3 months follow up  with Dr. de Peru  You will receive a text message or e-mail with a link to a survey about your care and experience with us  today! We would greatly appreciate your feedback!   Thanks for letting us  be apart of your health journey!!  Primary Care and Sports Medicine   Dr. Quintin sheerer Peru   We encourage you to activate your patient portal called MyChart.  Sign up information is provided on this After Visit Summary.  MyChart is used to connect with patients for Virtual Visits (Telemedicine).  Patients are able to view lab/test results, encounter notes, upcoming appointments, etc.  Non-urgent messages can be sent to your provider as well. To learn more about what you can do with MyChart, please visit --  [email or web address].

## 2024-05-31 NOTE — Progress Notes (Signed)
 New Patient Office Visit  Subjective   Patient ID: Joyce Barnes, female    DOB: 2000/10/17  Age: 23 y.o. MRN: [id number]  CC:  Chief Complaint  Patient presents with   New Patient (Initial Visit)    New Patient - has not had pcp in awhile has spot in the top of head she feels like this is growing noticed it 1 month ago does not hurt but she has been getting headaches     HPI [name] presents to establish care Last PCP - none In-person interpreter utilized for appointment  HTN: chronic issue; was being managed by OBGYN during pregnancy with labetalol  and nifedipine . She does check BP at home, this will be around 130/80.  She is not currently taking any medication, reports that medication was stopped as she was no longer receiving any refills.  Has been having some headaches recently. Thinks it could be related to some vision changes and is planning to have vision checked in the near future.  Small bump on top of head. Present for about 1 month, some discomfort with this. Has not changed significantly since first noted.  Did have some recent ED visits related to various issues including chest pain, palpitations, ear pain. No further issues with these since last ED visit.  Patient is originally from [place], has lived here for about 10 years. Patient does not work currently; she enjoys walking.  Outpatient Encounter Medications as of 05/31/2024  Medication Sig   hydrOXYzine (ATARAX) 10 MG tablet Take 1 tablet (10 mg total) by mouth 3 (three) times daily as needed. (Patient not taking: Reported on 05/31/2024)   [DISCONTINUED] Norethindrone Acetate-Ethinyl Estrad-FE (LOESTRIN 24 FE) 1-20 MG-MCG(24) tablet Take 1 tablet by mouth daily. (Patient not taking: Reported on 05/22/2024)   No facility-administered encounter medications on file as of 05/31/2024.    Past Medical History:  Diagnosis Date   Hypertension     Past Surgical History:  Procedure  Laterality Date   APPENDECTOMY     CESAREAN SECTION N/A 05/11/2021   Procedure: CESAREAN SECTION;  Surgeon: Cleatus Moccasin, MD;  Location: [facility] LD [facility];  Service: Obstetrics;  Laterality: N/A;    Family History  Problem Relation Age of Onset   Healthy Mother    Diabetes Father    Cancer Maternal Aunt        liver   Colon cancer Maternal Uncle     Social History   Socioeconomic History   Marital status: Single    Spouse name: Not on file   Number of children: Not on file   Years of education: Not on file   Highest education level: Not on file  Occupational History   Not on file  Tobacco Use   Smoking status: Never    Passive exposure: Never   Smokeless tobacco: Never  Vaping Use   Vaping status: Never Used  Substance and Sexual Activity   Alcohol use: Not Currently   Drug use: Never   Sexual activity: Not Currently    Birth control/protection: None  Other Topics Concern   Not on file  Social History Narrative   Not on file   Social Drivers of Health   Financial Resource Strain: Low Risk  (03/16/2023)   Received from [facility]   Overall Financial Resource Strain (CARDIA)    Difficulty of Paying Living Expenses: Not hard at all  Food Insecurity: No Food Insecurity (03/16/2023)   Received from [facility]   Hunger Vital  Sign    Within the past 12 months, you worried that your food would run out before you got the money to buy more.: Never true    Within the past 12 months, the food you bought just didn't last and you didn't have money to get more.: Never true  Transportation Needs: No Transportation Needs (03/16/2023)   Received from [facility]   PRAPARE - Transportation    Lack of Transportation (Medical): No    Lack of Transportation (Non-Medical): No  Physical Activity: Unknown (03/16/2023)   Received from [facility]   Exercise Vital Sign    On average, how many days per week do you engage in moderate to strenuous exercise (like a brisk walk)?: 0 days     Minutes of Exercise per Session: Not on file  Stress: No Stress Concern Present (03/16/2023)   Received from [facility] - Occupational Stress Questionnaire    Feeling of Stress : Not at all  Social Connections: Socially Integrated (03/16/2023)   Received from [facility]   Social Network    How would you rate your social network (family, work, friends)?: Good participation with social networks  Intimate Partner Violence: Not At Risk (03/16/2023)   Received from [facility]   HITS    Over the last 12 months how often did your partner physically hurt you?: Never    Over the last 12 months how often did your partner insult you or talk down to you?: Never    Over the last 12 months how often did your partner threaten you with physical harm?: Never    Over the last 12 months how often did your partner scream or curse at you?: Never    Objective   BP (!) 146/88 (BP Location: Right Arm, Patient Position: Sitting, Cuff Size: Large)   Pulse 95   Wt 265 lb 8 oz (120.4 kg)   LMP 05/29/2024   SpO2 100%   BMI 42.85 kg/m   Physical Exam  23 year old female in no acute distress Cardiovascular exam with regular rate and rhythm Lungs clear to auscultation bilaterally In area indicated on top of head, no obvious skin changes noted.  There is maybe a very small area of swelling over scalp.  Difficult to fully appreciate distinct nodule or mass.  Assessment & Plan:   Benign essential hypertension Assessment & Plan: Blood pressure slightly elevated on initial reading, did improve on recheck.  She reports that when checking at home, readings are borderline controlled. Discussed considerations, can primarily focus on lifestyle modifications at this time given home blood pressure readings and borderline control in office today.  Ultimately, if starting back on blood pressure medication, would labetalol  or nifedipine , likely with preference for  labetalol . Recommend intermittent monitoring of blood pressure at home, DASH diet   Wellness examination -     CBC with Differential/Platelet; Future -     Comprehensive metabolic panel with GFR; Future -     Hemoglobin A1c; Future -     Lipid panel; Future -     TSH Rfx on Abnormal to Free T4; Future  Nodule of skin of head Assessment & Plan: Discussed considerations.  Exam without any obvious concerning features.  She ultimately would like to have referral placed to meet with dermatologist for further evaluation, can proceed with referral today.  Orders: -     Ambulatory referral to Dermatology  Return in about 3 months (around 08/31/2024) for CPE with  fasting labs 1 week prior.   Spent 48 minutes on this patient encounter, including preparation, chart review, face-to-face counseling with patient and coordination of care, and documentation of encounter   ___________________________________________ Seher Schlagel de [place], MD, ABFM, [facility] Primary Care and Sports Medicine [facility]

## 2024-05-31 NOTE — Telephone Encounter (Signed)
 Patient is requesting a call back to give her some results for a test she took.

## 2024-06-01 LAB — CATECHOLAMINES, FRACTIONATED, URINE, 24 HOUR
Dopamine , 24H Ur: 340 ug/(24.h) (ref 0–510)
Dopamine, Rand Ur: 126 ug/L
Epinephrine, 24H Ur: <8 ug/(24.h) (ref 0–20)
Epinephrine, Rand Ur: <3 ug/L
Norepinephrine, 24H Ur: <41 ug/(24.h) (ref 0–135)
Norepinephrine, Rand Ur: <15 ug/L

## 2024-06-01 NOTE — Telephone Encounter (Signed)
 I called patient with [employer] [phone] and inquired what results she is calling regarding . She reports it was a 24 hour urine she turned in Friday. Per chart has a result for a 24 hr catecholamine test. I explained we have the result and I am not sure if provider has reviewed and I need to have provider review and interpret and we will call her back. She voices understanding. Rock Skip PEAK

## 2024-06-07 ENCOUNTER — Ambulatory Visit

## 2024-06-11 ENCOUNTER — Emergency Department (HOSPITAL_COMMUNITY)
Admission: EM | Admit: 2024-06-11 | Discharge: 2024-06-12 | Disposition: A | Discharge status: Home / Self Care | Attending: Emergency Medicine | Admitting: Emergency Medicine

## 2024-06-11 ENCOUNTER — Encounter (HOSPITAL_COMMUNITY): Payer: Self-pay

## 2024-06-11 ENCOUNTER — Other Ambulatory Visit: Payer: Self-pay

## 2024-06-11 DIAGNOSIS — R519 Headache, unspecified: Secondary | ICD-10-CM | POA: Diagnosis not present

## 2024-06-11 DIAGNOSIS — R253 Fasciculation: Secondary | ICD-10-CM | POA: Diagnosis not present

## 2024-06-11 DIAGNOSIS — R002 Palpitations: Secondary | ICD-10-CM | POA: Diagnosis not present

## 2024-06-11 LAB — PREGNANCY, URINE: Preg Test, Ur: NEGATIVE

## 2024-06-11 NOTE — ED Triage Notes (Signed)
 Pt c/o headache x 1 week, pain behind right eye x 2 days, left arm tingling x 4 days

## 2024-06-11 NOTE — ED Triage Notes (Signed)
 Pt arrived from home via POV c/o headache  9/10 pain scale x 2 days and pt states that her right eye is blinking too much.

## 2024-06-12 MED ORDER — DEXAMETHASONE 4 MG PO TABS
10.0000 mg | ORAL_TABLET | Freq: Once | ORAL | Status: AC
  Administered 2024-06-12: 10 mg via ORAL
  Filled 2024-06-12: qty 3

## 2024-06-12 MED ORDER — DIPHENHYDRAMINE HCL 50 MG/ML IJ SOLN
25.0000 mg | Freq: Once | INTRAMUSCULAR | Status: AC
  Administered 2024-06-12: 25 mg via INTRAMUSCULAR
  Filled 2024-06-12: qty 1

## 2024-06-12 MED ORDER — PROCHLORPERAZINE EDISYLATE 10 MG/2ML IJ SOLN
10.0000 mg | Freq: Once | INTRAMUSCULAR | Status: AC
  Administered 2024-06-12: 10 mg via INTRAMUSCULAR
  Filled 2024-06-12: qty 2

## 2024-06-12 NOTE — ED Provider Notes (Signed)
 [place] EMERGENCY DEPARTMENT AT [facility] Provider Note   CSN: [id number] Arrival date & time: 06/11/24  2223     Patient presents with: Headache   Joyce Barnes is a 23 y.o. female.   23 yo F with a chief complaints of a headache.  This been going on for a couple weeks.  Seems to come and go mostly right-sided.  She said she has had some right eye twitching as well.  Also has felt like she has had some palpitations.    Headache      Prior to Admission medications   Medication Sig Start Date End Date Taking? Authorizing Provider  hydrOXYzine (ATARAX) 10 MG tablet Take 1 tablet (10 mg total) by mouth 3 (three) times daily as needed. Patient not taking: Reported on 05/31/2024 05/22/24   Fredirick Glenys RAMAN, MD    Allergies: Patient has no known allergies.    Review of Systems  Neurological:  Positive for headaches.    Updated Vital Signs BP (!) 148/90 (BP Location: Right Arm)   Pulse 83   Temp 99 F (37.2 C) (Oral)   Resp 17   Ht 5' 6 (1.676 m)   Wt 120 kg   LMP 05/29/2024 (Exact Date)   SpO2 100%   BMI 42.70 kg/m   Physical Exam Vitals and nursing note reviewed.  Constitutional:      General: She is not in acute distress.    Appearance: She is well-developed. She is not diaphoretic.  HENT:     Head: Normocephalic and atraumatic.  Eyes:     Pupils: Pupils are equal, round, and reactive to light.  Cardiovascular:     Rate and Rhythm: Normal rate and regular rhythm.     Heart sounds: No murmur heard.    No friction rub. No gallop.  Pulmonary:     Effort: Pulmonary effort is normal.     Breath sounds: No wheezing or rales.  Abdominal:     General: There is no distension.     Palpations: Abdomen is soft.     Tenderness: There is no abdominal tenderness.  Musculoskeletal:        General: No tenderness.     Cervical back: Normal range of motion and neck supple.  Skin:    General: Skin is warm and dry.  Neurological:     Mental Status:  She is alert and oriented to person, place, and time.     GCS: GCS eye subscore is 4. GCS verbal subscore is 5. GCS motor subscore is 6.     Cranial Nerves: Cranial nerves 2-12 are intact.     Sensory: Sensation is intact.     Motor: Motor function is intact.     Coordination: Coordination is intact.     Comments: Benign neurologic exam  Psychiatric:        Behavior: Behavior normal.     (all labs ordered are listed, but only abnormal results are displayed) Labs Reviewed  PREGNANCY, URINE    EKG: None  Radiology: No results found.   Procedures   Medications Ordered in the ED  prochlorperazine (COMPAZINE) injection 10 mg (10 mg Intramuscular Given 06/12/24 0207)  diphenhydrAMINE  (BENADRYL ) injection 25 mg (25 mg Intramuscular Given 06/12/24 0207)  dexamethasone  (DECADRON ) tablet 10 mg (10 mg Oral Given 06/12/24 0207)  Medical Decision Making Risk Prescription drug management.   23 yo F with a chief complaints of a headache.  This been going on for at least a couple weeks.  Seems to come and go.  She does not think anything makes it better or worse.  Has a benign neurologic exam.  No red flags.  Will give a headache cocktail here.  Give neurology follow-up.  PCP follow-up.  My record review the patient actually is been complaining of a lesion to the vertex of her head and has been seen in the ED now multiple times for this.  She has also seen her PCP for this as well.  I do not appreciate any obvious lesion on my exam.  It sounds like she has been referred to dermatology.  2:57 AM:  I have discussed the diagnosis/risks/treatment options with the patient.  Evaluation and diagnostic testing in the emergency department does not suggest an emergent condition requiring admission or immediate intervention beyond what has been performed at this time.  They will follow up with PCP, neuro. We also discussed returning to the ED immediately if new or  worsening sx occur. We discussed the sx which are most concerning (e.g., sudden worsening pain, fever, inability to tolerate by mouth, sudden worsening headache stroke s/sx) that necessitate immediate return. Medications administered to the patient during their visit and any new prescriptions provided to the patient are listed below.  Medications given during this visit Medications  prochlorperazine (COMPAZINE) injection 10 mg (10 mg Intramuscular Given 06/12/24 0207)  diphenhydrAMINE  (BENADRYL ) injection 25 mg (25 mg Intramuscular Given 06/12/24 0207)  dexamethasone  (DECADRON ) tablet 10 mg (10 mg Oral Given 06/12/24 0207)     The patient appears reasonably screen and/or stabilized for discharge and I doubt any other medical condition or other [facility] requiring further screening, evaluation, or treatment in the ED at this time prior to discharge.       Final diagnoses:  Right-sided headache    ED Discharge Orders          Ordered    Ambulatory referral to Neurology       Comments: New headache syndrome?   06/12/24 0155               Emil Share, DO 06/12/24 9742

## 2024-06-12 NOTE — Discharge Instructions (Signed)
 Please let your family doctor know that you came to the emergency department for evaluation.  I have placed a order for the neurologist to call you on the phone to try and set up an appointments.  Please return for sudden worsening headache one-sided numbness or weakness or difficulty speech or swallowing.  Por favor, informe a su mdico de cabecera que acudi a urgencias para una evaluacin. He solicitado que printmaker llame por telfono para sports administrator una cita.  Por favor, regrese si presenta un empeoramiento repentino del dolor de cabeza, entumecimiento o debilidad unilateral, o dificultad para hablar o tragar.

## 2024-08-02 ENCOUNTER — Ambulatory Visit (HOSPITAL_BASED_OUTPATIENT_CLINIC_OR_DEPARTMENT_OTHER): Admitting: Family Medicine

## 2024-08-08 ENCOUNTER — Ambulatory Visit: Admitting: Neurology

## 2024-08-08 ENCOUNTER — Encounter: Payer: Self-pay | Admitting: Neurology

## 2024-08-08 VITALS — BP 132/78 | HR 70 | Ht 64.0 in | Wt 239.0 lb

## 2024-08-08 DIAGNOSIS — G44209 Tension-type headache, unspecified, not intractable: Secondary | ICD-10-CM

## 2024-08-08 DIAGNOSIS — F419 Anxiety disorder, unspecified: Secondary | ICD-10-CM | POA: Diagnosis not present

## 2024-08-08 NOTE — Patient Instructions (Signed)
 Continue current medications  Start Magnesium Glycinate, 250 mg nightly  Continue with occasional Tylenol  or Motrin  as needed  Return if worse

## 2024-08-08 NOTE — Progress Notes (Signed)
 "   [facility]  PATIENT: Joyce Barnes DOB: July 18, 2001  REQUESTING CLINICIAN: Emil Share, DO HISTORY FROM: Patient via interpretor  REASON FOR VISIT: Headaches    HISTORICAL  CHIEF COMPLAINT:  Chief Complaint  Patient presents with   New Patient (Initial Visit)    Room13 With interpeter internal ED referral for headaches     HISTORY OF PRESENT ILLNESS:  Discussed the use of AI scribe software for clinical note transcription with the patient, who gave verbal consent to proceed.  Joyce Barnes is a 23 year old female with history of obesity, anxiety who presents with frequent headaches and anxiety following the removal of Nexplanon  4 months ago.  She began experiencing headaches approximately four months ago after the removal of her Nexplanon  implant. Initially, the headaches were daily, but they have since decreased in frequency to about once a week. She describes them as a 'prickly feeling' on the side of her head, lasting less than a minute. She uses Tylenol  for relief when needed. No associated nausea, vomiting, or photophobia. She reports having vision problems and uses glasses, and she acknowledges the need for an eye check-up.  Since the removal of the Nexplanon , she has also experienced increased anxiety and panic-like symptoms. She often wakes up feeling jittery and nervous, and on one occasion, she was awakened by a sensation like a cramp in her head. Her stress and anxiety levels have been elevated since the implant removal, and she has no prior history of such symptoms. Her sleep is generally good, although she sometimes wakes up anxious but can return to sleep within ten minutes.  There is a family history of headaches, as her mother also suffers from them. She uses glasses for vision problems and acknowledges the need for an eye check-up.    OTHER MEDICAL CONDITIONS: Obesity, Anxiety    REVIEW OF SYSTEMS: Full 14 system review of  systems performed and negative with exception of: As noted in the HPI  ALLERGIES: Allergies[1]  HOME MEDICATIONS: Outpatient Medications Prior to Visit  Medication Sig Dispense Refill   hydrOXYzine  (ATARAX ) 10 MG tablet Take 1 tablet (10 mg total) by mouth 3 (three) times daily as needed. (Patient not taking: Reported on 08/08/2024) 30 tablet 0   No facility-administered medications prior to visit.    PAST MEDICAL HISTORY: Past Medical History:  Diagnosis Date   Hypertension     PAST SURGICAL HISTORY: Past Surgical History:  Procedure Laterality Date   APPENDECTOMY     CESAREAN SECTION N/A 05/11/2021   Procedure: CESAREAN SECTION;  Surgeon: Cleatus Moccasin, MD;  Location: [facility] LD ORS;  Service: Obstetrics;  Laterality: N/A;    FAMILY HISTORY: Family History  Problem Relation Age of Onset   Healthy Mother    Diabetes Father    Cancer Maternal Aunt        liver   Colon cancer Maternal Uncle     SOCIAL HISTORY: Social History   Socioeconomic History   Marital status: Single    Spouse name: Not on file   Number of children: Not on file   Years of education: Not on file   Highest education level: Not on file  Occupational History   Not on file  Tobacco Use   Smoking status: Never    Passive exposure: Never   Smokeless tobacco: Never  Vaping Use   Vaping status: Never Used  Substance and Sexual Activity   Alcohol use: Not Currently   Drug use: Never  Sexual activity: Not Currently    Birth control/protection: None  Other Topics Concern   Not on file  Social History Narrative   Not on file   Social Drivers of Health   Tobacco Use: Low Risk (08/08/2024)   Patient History    Smoking Tobacco Use: Never    Smokeless Tobacco Use: Never    Passive Exposure: Never  Financial Resource Strain: Low Risk (03/16/2023)   Received from [facility]   Overall Financial Resource Strain (CARDIA)    Difficulty of Paying Living Expenses: Not hard at all  Food Insecurity:  No Food Insecurity (03/16/2023)   Received from [facility]   Epic    Within the past 12 months, you worried that your food would run out before you got the money to buy more.: Never true    Within the past 12 months, the food you bought just didn't last and you didn't have money to get more.: Never true  Transportation Needs: No Transportation Needs (03/16/2023)   Received from [facility] - Transportation    Lack of Transportation (Medical): No    Lack of Transportation (Non-Medical): No  Physical Activity: Unknown (03/16/2023)   Received from [facility]   Exercise Vital Sign    On average, how many days per week do you engage in moderate to strenuous exercise (like a brisk walk)?: 0 days    Minutes of Exercise per Session: Not on file  Stress: No Stress Concern Present (03/16/2023)   Received from [facility]ational [facility] - Occupational Stress Questionnaire    Feeling of Stress : Not at all  Social Connections: Socially Integrated (03/16/2023)   Received from [facility]   Social Network    How would you rate your social network (family, work, friends)?: Good participation with social networks  Intimate Partner Violence: Not At Risk (03/16/2023)   Received from [facility]   HITS    Over the last 12 months how often did your partner physically hurt you?: Never    Over the last 12 months how often did your partner insult you or talk down to you?: Never    Over the last 12 months how often did your partner threaten you with physical harm?: Never    Over the last 12 months how often did your partner scream or curse at you?: Never  Depression (PHQ2-9): Low Risk (05/31/2024)   Depression (PHQ2-9)    PHQ-2 Score: 0  Alcohol Screen: Not on file  Housing: Not on file  Utilities: Not At Risk (03/16/2023)   Received from [facility] Utilities    Threatened with loss of utilities: No  Health Literacy: Not on file    PHYSICAL  EXAM  GENERAL EXAM/CONSTITUTIONAL: Vitals:  Vitals:   08/08/24 0843 08/08/24 0912  BP: (!) 141/86 132/78  Pulse: 98 70  SpO2: 99%   Weight: 239 lb (108.4 kg)   Height: 5' 4 (1.626 m)    Body mass index is 41.02 kg/m. Wt Readings from Last 3 Encounters:  08/08/24 239 lb (108.4 kg)  06/11/24 264 lb 8.8 oz (120 kg)  05/31/24 265 lb 8 oz (120.4 kg)   Patient is in no distress; well developed, nourished and groomed; neck is supple  MUSCULOSKELETAL: Gait, strength, tone, movements noted in Neurologic exam below  NEUROLOGIC: MENTAL STATUS:      No data to display         awake, alert, oriented  to person, place and time recent and remote memory intact normal attention and concentration language fluent, comprehension intact, naming intact fund of knowledge appropriate  CRANIAL NERVE:  2nd - no papilledema or hemorrhages on fundoscopic exam 2nd, 3rd, 4th, 6th - pupils equal and reactive to light, visual fields full to confrontation, extraocular muscles intact, no nystagmus 5th - facial sensation symmetric 7th - facial strength symmetric 8th - hearing intact 9th - palate elevates symmetrically, uvula midline 11th - shoulder shrug symmetric 12th - tongue protrusion midline  MOTOR:  normal bulk and tone, full strength in the BUE, BLE  SENSORY:  normal and symmetric to light touch, pinprick  COORDINATION:  finger-nose-finger, fine finger movements normal  GAIT/STATION:  normal   DIAGNOSTIC DATA (LABS, IMAGING, TESTING) - I reviewed patient records, labs, notes, testing and imaging myself where available.  Lab Results  Component Value Date   WBC 8.2 04/29/2024   HGB 15.4 (H) 04/29/2024   HCT 44.0 04/29/2024   MCV 84.9 04/29/2024   PLT 310 04/29/2024      Component Value Date/Time   NA 135 04/29/2024 1154   NA 137 09/18/2020 1634   K 4.1 04/29/2024 1154   CL 103 04/29/2024 1154   CO2 21 (L) 04/29/2024 1154   GLUCOSE 107 (H) 04/29/2024 1154   BUN 9  04/29/2024 1154   BUN 6 09/18/2020 1634   CREATININE 0.61 04/29/2024 1154   CALCIUM 9.2 04/29/2024 1154   PROT 7.1 04/29/2024 1154   PROT 7.3 09/18/2020 1634   ALBUMIN 4.0 04/29/2024 1154   ALBUMIN 4.5 09/18/2020 1634   AST 37 04/29/2024 1154   ALT 56 (H) 04/29/2024 1154   ALKPHOS 79 04/29/2024 1154   BILITOT 0.9 04/29/2024 1154   BILITOT 0.3 09/18/2020 1634   GFRNONAA >60 04/29/2024 1154   GFRAA 150 09/18/2020 1634   No results found for: CHOL, HDL, LDLCALC, LDLDIRECT, TRIG, CHOLHDL Lab Results  Component Value Date   HGBA1C 5.0 09/18/2020   No results found for: [id number] Lab Results  Component Value Date   TSH 1.310 05/22/2024      ASSESSMENT AND PLAN  23 y.o. year old female with obesity, anxiety who is presenting for headaches.    Tension-type headache Intermittent tension-type headaches occurring approximately once a week, lasting less than a minute. Symptoms began after removal of Nexplanon  four months ago. No associated nausea, vomiting, or photophobia. Family history of headaches. High stress levels reported. Current frequency does not warrant daily medication. - Recommended magnesium glycinate supplement nightly. - Advised to report if symptoms persist or worsen.  Scalp epidermal cyst Small, elongated bump on the scalp identified as a benign epidermal cyst. No associated pain or headache. No intervention required unless it enlarges or becomes symptomatic. - Monitor cyst for changes in size or symptoms. - Advised against manipulation to prevent irritation or infection.     1. Tension headache   2. Anxiety     Patient Instructions  Continue current medications  Start Magnesium Glycinate, 250 mg nightly  Continue with occasional Tylenol  or Motrin  as needed  Return if worse    No orders of the defined types were placed in this encounter.   No orders of the defined types were placed in this encounter.   Return if symptoms worsen or  fail to improve.    Pastor Falling, MD 08/08/2024, 12:46 PM  [facility] [address] [place], [place] [address] [phone]          [  1] No Known Allergies  "

## 2024-09-11 ENCOUNTER — Encounter (HOSPITAL_BASED_OUTPATIENT_CLINIC_OR_DEPARTMENT_OTHER): Payer: Self-pay | Admitting: *Deleted

## 2024-09-12 ENCOUNTER — Ambulatory Visit (HOSPITAL_BASED_OUTPATIENT_CLINIC_OR_DEPARTMENT_OTHER)

## 2024-09-18 ENCOUNTER — Ambulatory Visit (HOSPITAL_BASED_OUTPATIENT_CLINIC_OR_DEPARTMENT_OTHER)

## 2024-09-18 ENCOUNTER — Encounter (HOSPITAL_BASED_OUTPATIENT_CLINIC_OR_DEPARTMENT_OTHER): Payer: Self-pay

## 2024-09-19 ENCOUNTER — Ambulatory Visit (HOSPITAL_BASED_OUTPATIENT_CLINIC_OR_DEPARTMENT_OTHER)

## 2024-09-20 ENCOUNTER — Encounter (HOSPITAL_BASED_OUTPATIENT_CLINIC_OR_DEPARTMENT_OTHER): Admitting: Family Medicine

## 2025-01-03 ENCOUNTER — Ambulatory Visit: Admitting: Physician Assistant
